# Patient Record
Sex: Male | Born: 1950 | Race: White | Hispanic: No | Marital: Married | Smoking: Former smoker
Health system: Southern US, Community
[De-identification: ages and names within clinical notes are randomized; demographics above are authoritative.]

## PROBLEM LIST (undated history)

## (undated) DIAGNOSIS — I10 Essential (primary) hypertension: Secondary | ICD-10-CM

## (undated) DIAGNOSIS — J984 Other disorders of lung: Secondary | ICD-10-CM

## (undated) DIAGNOSIS — E785 Hyperlipidemia, unspecified: Secondary | ICD-10-CM

## (undated) DIAGNOSIS — J189 Pneumonia, unspecified organism: Secondary | ICD-10-CM

## (undated) DIAGNOSIS — K4090 Unilateral inguinal hernia, without obstruction or gangrene, not specified as recurrent: Secondary | ICD-10-CM

---
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
## 2017-12-06 ENCOUNTER — Ambulatory Visit: Payer: Self-pay | Admitting: Surgery

## 2017-12-06 ENCOUNTER — Encounter: Payer: Self-pay | Admitting: Surgery

## 2017-12-06 DIAGNOSIS — I10 Essential (primary) hypertension: Secondary | ICD-10-CM

## 2017-12-06 DIAGNOSIS — K4091 Unilateral inguinal hernia, without obstruction or gangrene, recurrent: Secondary | ICD-10-CM

## 2017-12-06 NOTE — H&P (View-Only) (Signed)
Jim Rowland Documented: 12/06/2017 9:37 AM Location: [facility] Patient #: [id number] DOB: Jan 29, 1951 Married / Language: [name] / Race: White Male  History of Present Illness [name] MD; 12/06/2017 10:09 AM) The patient is a 67 year old male who presents with an inguinal hernia. Note for "Inguinal hernia": ` ` ` Patient sent for surgical consultation at the request of Dr. Drema Dallas.  Chief Complaint: Left groin hernia ` ` The patient is a very pleasant gentleman originally from [place]. Recalls having hernia repair when he was about 67 years old. Quite active. Left place soccer. However noted some groin swelling earlier this year. Mentioned it to his primary care physician. Inguinal hernia diagnosed. Surgical consultation offered since it seems to be getting larger. He is not smoking 14 years. He is not diabetic. He takes low-dose aspirin. Struggled with some constipation but with the help of prune juice, he now moves his bowels most mornings. He can walk at least half hour without difficulty. No prostate issues. No hesitancy or frequency or nocturia. No history of skin infections. No other abdominal surgeries. Otherwise healthy and active.  (Review of systems as stated in this history (HPI) or in the review of systems. Otherwise all other 12 point ROS are negative) ` ` `   Past Surgical History [name], MD; 12/06/2017 10:10 AM) No pertinent past surgical history Open Inguinal Hernia Surgery - Left [1955]:  Diagnostic Studies History ([name], [place]; 12/06/2017 9:37 AM) Colonoscopy 5-10 years ago  Allergies ([name], [place]; 12/06/2017 9:38 AM) No Known Drug Allergies [12/06/2017]: Allergies Reconciled  Medication History ([name], [place]; 12/06/2017 9:39 AM) Atorvastatin Calcium (10MG  Tablet, Oral) Active. Losartan Potassium-HCTZ (100-25MG  Tablet, Oral) Active. Medications Reconciled  Social History [name], MD; 12/06/2017 10:01 AM) Alcohol use Occasional alcohol use. Caffeine use Coffee. No drug use Tobacco use Former smoker. originally from [place]. Lived in the [place] 1997  Family History ([name], [place]; 12/06/2017 9:37 AM) Hypertension Father. Prostate Cancer Father.  Other Problems ([name], [place]; 12/06/2017 9:37 AM) High blood pressure     Review of Systems ([name]; 12/06/2017 9:37 AM) General Not Present- Appetite Loss, Chills, Fatigue, Fever, Night Sweats, Weight Gain and Weight Loss. Skin Not Present- Change in Wart/Mole, Dryness, Hives, Jaundice, New Lesions, Non-Healing Wounds, Rash and Ulcer. HEENT Not Present- Earache, Hearing Loss, Hoarseness, Nose Bleed, Oral Ulcers, Ringing in the Ears, Seasonal Allergies, Sinus Pain, Sore Throat, Visual Disturbances, Wears glasses/contact lenses and Yellow Eyes. Respiratory Not Present- Bloody sputum, Chronic Cough, Difficulty Breathing, Snoring and Wheezing. Breast Not Present- Breast Mass, Breast Pain, Nipple Discharge and Skin Changes. Cardiovascular Not Present- Chest Pain, Difficulty Breathing Lying Down, Leg Cramps, Palpitations, Rapid Heart Rate, Shortness of Breath and Swelling of Extremities. Gastrointestinal Not Present- Abdominal Pain, Bloating, Bloody Stool, Change in Bowel Habits, Chronic diarrhea, Constipation, Difficulty Swallowing, Excessive gas, Gets full quickly at meals, Hemorrhoids, Indigestion, Nausea, Rectal Pain and Vomiting. Male Genitourinary Not Present- Blood in Urine, Change in Urinary Stream, Frequency, Impotence, Nocturia, Painful Urination, Urgency and Urine Leakage. Musculoskeletal Not Present- Back Pain, Joint Pain, Joint Stiffness, Muscle Pain, Muscle Weakness and Swelling of Extremities. Neurological Not Present- Decreased Memory, Fainting, Headaches, Numbness, Seizures, Tingling, Tremor, Trouble walking and Weakness. Psychiatric Not Present-  Anxiety, Bipolar, Change in Sleep Pattern, Depression, Fearful and Frequent crying. Endocrine Not Present- Cold Intolerance, Excessive Hunger, Hair Changes, Heat Intolerance, Hot flashes and New Diabetes. Hematology Not Present- Blood  Thinners, Easy Bruising, Excessive bleeding, Gland problems, HIV and Persistent Infections.  Vitals ([name]; 12/06/2017 9:38 AM) 12/06/2017 9:38 AM Weight: 179.4 lb Height: 67in Body Surface Area: 1.93 m Body Mass Index: 28.1 kg/m  Temp.: 98.6F  Pulse: 92 (Regular)  BP: 142/80 (Sitting, Left Arm, Standard)      Assessment & Plan [name] MD; 12/06/2017 10:05 AM)  RECURRENT INGUINAL HERNIA OF LEFT SIDE WITHOUT OBSTRUCTION OR GANGRENE (K40.91) Impression: Obvious recurrent left inguinal hernia. No definitely on the right side  I think he would benefit from repair since he is quite active and it is gotten larger in the past few months. He is interested in proceeding.   PREOP - ING HERNIA - ENCOUNTER FOR PREOPERATIVE EXAMINATION FOR GENERAL SURGICAL PROCEDURE (Z01.818)  Current Plans You are being scheduled for surgery- Our schedulers will call you.  You should hear from our office's scheduling department within 5 working days about the location, date, and time of surgery. We try to make accommodations for patient's preferences in scheduling surgery, but sometimes the OR schedule or the surgeon's schedule prevents [place] from making those accommodations.  If you have not heard from our office [phone]) in 5 working days, call the office and ask for your surgeon's nurse.  If you have other questions about your diagnosis, plan, or surgery, call the office and ask for your surgeon's nurse.  Written instructions provided The anatomy & physiology of the abdominal wall and pelvic floor was discussed. The pathophysiology of hernias in the inguinal and pelvic region was discussed. Natural history risks such as progressive  enlargement, pain, incarceration, and strangulation was discussed. Contributors to complications such as smoking, obesity, diabetes, prior surgery, etc were discussed.  I feel the risks of no intervention will lead to serious problems that outweigh the operative risks; therefore, I recommended surgery to reduce and repair the hernia. I explained laparoscopic techniques with possible need for an open approach. I noted usual use of mesh to patch and/or buttress hernia repair  Risks such as bleeding, infection, abscess, need for further treatment, heart attack, death, and other risks were discussed. I noted a good likelihood this will help address the problem. Goals of post-operative recovery were discussed as well. Possibility that this will not correct all symptoms was explained. I stressed the importance of low-impact activity, aggressive pain control, avoiding constipation, & not pushing through pain to minimize risk of post-operative chronic pain or injury. Possibility of reherniation was discussed. We will work to minimize complications.  An educational handout further explaining the pathology & treatment options was given as well. Questions were answered. The patient expresses understanding & wishes to proceed with surgery.  Pt Education - Pamphlet Given - Laparoscopic Hernia Repair: discussed with patient and provided information. Pt Education - CCS Pain Control (Caffie Sotto) Pt Education - CCS Hernia Post-Op HCI (Alaylah Heatherington): discussed with patient and provided information. Pt Education - CCS Mesh education: discussed with patient and provided information.  [name], MD, FACS, MASCRS Gastrointestinal and Minimally Invasive Surgery    [address] [place], [place] [address] [phone] Main / Paging [phone] Fax

## 2017-12-06 NOTE — H&P (Signed)
Jim Rowland Documented: 12/06/2017 9:37 AM Location: [facility] Patient #: [id number] DOB: 17-Feb-1951 Married / Language: Jim Rowland / Race: White Male  History of Present Illness Jim Hector MD; 12/06/2017 10:09 AM) The patient is a 67 year old male who presents with an inguinal hernia. Note for "Inguinal hernia": ` ` ` Patient sent for surgical consultation at the request of Dr. Drema Rowland.  Chief Complaint: Left groin hernia ` ` The patient is a very pleasant gentleman originally from [place]. Recalls having hernia repair when he was about 67 years old. Quite active. Left place soccer. However noted some groin swelling earlier this year. Mentioned it to his primary care physician. Inguinal hernia diagnosed. Surgical consultation offered since it seems to be getting larger. He is not smoking 14 years. He is not diabetic. He takes low-dose aspirin. Struggled with some constipation but with the help of prune juice, he now moves his bowels most mornings. He can walk at least half hour without difficulty. No prostate issues. No hesitancy or frequency or nocturia. No history of skin infections. No other abdominal surgeries. Otherwise healthy and active.  (Review of systems as stated in this history (HPI) or in the review of systems. Otherwise all other 12 point ROS are negative) ` ` `   Past Surgical History Jim Hector, MD; 12/06/2017 10:10 AM) No pertinent past surgical history Open Inguinal Hernia Surgery - Left [1955]:  Diagnostic Studies History (Jim Rowland, [place]; 12/06/2017 9:37 AM) Colonoscopy 5-10 years ago  Allergies (Jim Rowland, [place]; 12/06/2017 9:38 AM) No Known Drug Allergies [12/06/2017]: Allergies Reconciled  Medication History (Jim Rowland, [place]; 12/06/2017 9:39 AM) Atorvastatin Calcium (10MG  Tablet, Oral) Active. Losartan Potassium-HCTZ (100-25MG  Tablet, Oral) Active. Medications Reconciled  Social History Jim Hector, MD; 12/06/2017 10:01 AM) Alcohol use Occasional alcohol use. Caffeine use Coffee. No drug use Tobacco use Former smoker. originally from [place]. Lived in [place] 1997  Family History (Jim Rowland, [place]; 12/06/2017 9:37 AM) Hypertension Father. Prostate Cancer Father.  Other Problems (Jim Rowland, [place]; 12/06/2017 9:37 AM) High blood pressure     Review of Systems (Jim Rowland; 12/06/2017 9:37 AM) General Not Present- Appetite Loss, Chills, Fatigue, Fever, Night Sweats, Weight Gain and Weight Loss. Skin Not Present- Change in Wart/Mole, Dryness, Hives, Jaundice, New Lesions, Non-Healing Wounds, Rash and Ulcer. HEENT Not Present- Earache, Hearing Loss, Hoarseness, Nose Bleed, Oral Ulcers, Ringing in the Ears, Seasonal Allergies, Sinus Pain, Sore Throat, Visual Disturbances, Wears glasses/contact lenses and Yellow Eyes. Respiratory Not Present- Bloody sputum, Chronic Cough, Difficulty Breathing, Snoring and Wheezing. Breast Not Present- Breast Mass, Breast Pain, Nipple Discharge and Skin Changes. Cardiovascular Not Present- Chest Pain, Difficulty Breathing Lying Down, Leg Cramps, Palpitations, Rapid Heart Rate, Shortness of Breath and Swelling of Extremities. Gastrointestinal Not Present- Abdominal Pain, Bloating, Bloody Stool, Change in Bowel Habits, Chronic diarrhea, Constipation, Difficulty Swallowing, Excessive gas, Gets full quickly at meals, Hemorrhoids, Indigestion, Nausea, Rectal Pain and Vomiting. Male Genitourinary Not Present- Blood in Urine, Change in Urinary Stream, Frequency, Impotence, Nocturia, Painful Urination, Urgency and Urine Leakage. Musculoskeletal Not Present- Back Pain, Joint Pain, Joint Stiffness, Muscle Pain, Muscle Weakness and Swelling of Extremities. Neurological Not Present- Decreased Memory, Fainting, Headaches, Numbness, Seizures, Tingling, Tremor, Trouble walking and Weakness. Psychiatric Not Present-  Anxiety, Bipolar, Change in Sleep Pattern, Depression, Fearful and Frequent crying. Endocrine Not Present- Cold Intolerance, Excessive Hunger, Hair Changes, Heat Intolerance, Hot flashes and New Diabetes. Hematology Not Present- Blood  Thinners, Easy Bruising, Excessive bleeding, Gland problems, HIV and Persistent Infections.  Vitals (Jim Rowland; 12/06/2017 9:38 AM) 12/06/2017 9:38 AM Weight: 179.4 lb Height: 67in Body Surface Area: 1.93 m Body Mass Index: 28.1 kg/m  Temp.: 98.53F  Pulse: 92 (Regular)  BP: 142/80 (Sitting, Left Arm, Standard)      Assessment & Plan Jim Hector MD; 12/06/2017 10:05 AM)  RECURRENT INGUINAL HERNIA OF LEFT SIDE WITHOUT OBSTRUCTION OR GANGRENE (K40.91) Impression: Obvious recurrent left inguinal hernia. No definitely on the right side  I think he would benefit from repair since he is quite active and it is gotten larger in the past few months. He is interested in proceeding.   PREOP - ING HERNIA - ENCOUNTER FOR PREOPERATIVE EXAMINATION FOR GENERAL SURGICAL PROCEDURE (Z01.818)  Current Plans You are being scheduled for surgery- Our schedulers will call you.  You should hear from our office's scheduling department within 5 working days about the location, date, and time of surgery. We try to make accommodations for patient's preferences in scheduling surgery, but sometimes the OR schedule or the surgeon's schedule prevents [place] from making those accommodations.  If you have not heard from our office [phone]) in 5 working days, call the office and ask for your surgeon's nurse.  If you have other questions about your diagnosis, plan, or surgery, call the office and ask for your surgeon's nurse.  Written instructions provided The anatomy & physiology of the abdominal wall and pelvic floor was discussed. The pathophysiology of hernias in the inguinal and pelvic region was discussed. Natural history risks such as progressive  enlargement, pain, incarceration, and strangulation was discussed. Contributors to complications such as smoking, obesity, diabetes, prior surgery, etc were discussed.  I feel the risks of no intervention will lead to serious problems that outweigh the operative risks; therefore, I recommended surgery to reduce and repair the hernia. I explained laparoscopic techniques with possible need for an open approach. I noted usual use of mesh to patch and/or buttress hernia repair  Risks such as bleeding, infection, abscess, need for further treatment, heart attack, death, and other risks were discussed. I noted a good likelihood this will help address the problem. Goals of post-operative recovery were discussed as well. Possibility that this will not correct all symptoms was explained. I stressed the importance of low-impact activity, aggressive pain control, avoiding constipation, & not pushing through pain to minimize risk of post-operative chronic pain or injury. Possibility of reherniation was discussed. We will work to minimize complications.  An educational handout further explaining the pathology & treatment options was given as well. Questions were answered. The patient expresses understanding & wishes to proceed with surgery.  Pt Education - Pamphlet Given - Laparoscopic Hernia Repair: discussed with patient and provided information. Pt Education - CCS Pain Control (Folasade Mooty) Pt Education - CCS Hernia Post-Op HCI (Sybella Harnish): discussed with patient and provided information. Pt Education - CCS Mesh education: discussed with patient and provided information.  Jim Hector, MD, FACS, MASCRS Gastrointestinal and Minimally Invasive Surgery    [address] [place], [place] [address] [phone] Main / Paging [phone] Fax

## 2017-12-08 NOTE — Patient Instructions (Signed)
Jim Rowland  12/08/2017   Your procedure is scheduled on: 12-15-17   Report to [facility] Main  Entrance    Report to admitting at 9:00AM    Call this number if you have problems the morning of surgery [phone]     Remember: NO SOLID FOOD AFTER MIDNIGHT THE NIGHT PRIOR TO SURGERY. NOTHING BY MOUTH EXCEPT CLEAR LIQUIDS UNTIL 3 HOURS PRIOR TO [place] SURGERY. PLEASE FINISH ENSURE DRINK PER SURGEON ORDER 3 HOURS PRIOR TO SCHEDULED SURGERY TIME WHICH NEEDS TO BE COMPLETED AT ___8:00AM______.   CLEAR LIQUID DIET   Foods Allowed                                                                     Foods Excluded  Coffee and tea, regular and decaf                             liquids that you cannot  Plain Jell-O in any flavor                                             see through such as: Fruit ices (not with fruit pulp)                                     milk, soups, orange juice  Iced Popsicles                                    All solid food Carbonated beverages, regular and diet                                    Cranberry, grape and apple juices Sports drinks like Gatorade Lightly seasoned clear broth or consume(fat free) Sugar, honey syrup  Sample Menu Breakfast                                Lunch                                     Supper Cranberry juice                    Beef broth                            Chicken broth Jell-O                                     Grape juice  Apple juice Coffee or tea                        Jell-O                                      Popsicle                                                Coffee or tea                        Coffee or tea  _____________________________________________________________________       Take these medicines the morning of surgery with A SIP OF WATER: ATORVASTATIN                                You may not have any metal on your body including hair pins  and              piercings  Do not wear jewelry, make-up, lotions, powders or perfumes, deodorant                      Men may shave face and neck.   Do not bring valuables to the hospital. Early.  Contacts, dentures or bridgework may not be worn into surgery.     Patients discharged the day of surgery will not be allowed to drive home.  Name and phone number of your driver:  Special Instructions: N/A              Please read over the following fact sheets you were given: _____________________________________________________________________             [facility] - Preparing for Surgery Before surgery, you can play an important role.  Because skin is not sterile, your skin needs to be as free of germs as possible.  You can reduce the number of germs on your skin by washing with CHG (chlorahexidine gluconate) soap before surgery.  CHG is an antiseptic cleaner which kills germs and bonds with the skin to continue killing germs even after washing. Please DO NOT use if you have an allergy to CHG or antibacterial soaps.  If your skin becomes reddened/irritated stop using the CHG and inform your nurse when you arrive at Short Stay. Do not shave (including legs and underarms) for at least 48 hours prior to the first CHG shower.  You may shave your face/neck. Please follow these instructions carefully:  1.  Shower with CHG Soap the night before surgery and the  morning of Surgery.  2.  If you choose to wash your hair, wash your hair first as usual with your  normal  shampoo.  3.  After you shampoo, rinse your hair and body thoroughly to remove the  shampoo.                           4.  Use CHG as you would any other liquid soap.  You can apply chg  directly  to the skin and wash                       Gently with a scrungie or clean washcloth.  5.  Apply the CHG Soap to your body ONLY FROM THE NECK DOWN.   Do not use on face/ open                            Wound or open sores. Avoid contact with eyes, ears mouth and genitals (private parts).                       Wash face,  Genitals (private parts) with your normal soap.             6.  Wash thoroughly, paying special attention to the area where your surgery  will be performed.  7.  Thoroughly rinse your body with warm water from the neck down.  8.  DO NOT shower/wash with your normal soap after using and rinsing off  the CHG Soap.                9.  Pat yourself dry with a clean towel.            10.  Wear clean pajamas.            11.  Place clean sheets on your bed the night of your first shower and do not  sleep with pets. Day of Surgery : Do not apply any lotions/deodorants the morning of surgery.  Please wear clean clothes to the hospital/surgery center.  FAILURE TO FOLLOW THESE INSTRUCTIONS MAY RESULT IN THE CANCELLATION OF YOUR SURGERY PATIENT SIGNATURE_________________________________  NURSE SIGNATURE__________________________________  ________________________________________________________________________

## 2017-12-09 ENCOUNTER — Other Ambulatory Visit: Payer: Self-pay

## 2017-12-09 ENCOUNTER — Encounter (HOSPITAL_COMMUNITY): Payer: Self-pay

## 2017-12-09 ENCOUNTER — Encounter (HOSPITAL_COMMUNITY)
Admission: RE | Admit: 2017-12-09 | Discharge: 2017-12-09 | Disposition: A | Discharge status: Home / Self Care | Payer: 59 | Source: Ambulatory Visit | Attending: Surgery | Admitting: Surgery

## 2017-12-09 DIAGNOSIS — Z01812 Encounter for preprocedural laboratory examination: Secondary | ICD-10-CM | POA: Insufficient documentation

## 2017-12-09 DIAGNOSIS — Z01810 Encounter for preprocedural cardiovascular examination: Secondary | ICD-10-CM | POA: Diagnosis not present

## 2017-12-09 DIAGNOSIS — K4091 Unilateral inguinal hernia, without obstruction or gangrene, recurrent: Secondary | ICD-10-CM | POA: Diagnosis not present

## 2017-12-09 HISTORY — DX: Unilateral inguinal hernia, without obstruction or gangrene, not specified as recurrent: K40.90

## 2017-12-09 HISTORY — DX: Other disorders of lung: J98.4

## 2017-12-09 HISTORY — DX: Pneumonia, unspecified organism: J18.9

## 2017-12-09 HISTORY — DX: Essential (primary) hypertension: I10

## 2017-12-09 HISTORY — DX: Hyperlipidemia, unspecified: E78.5

## 2017-12-09 LAB — BASIC METABOLIC PANEL
Anion gap: 7 (ref 5–15)
BUN: 20 mg/dL (ref 8–23)
CHLORIDE: 112 mmol/L — AB (ref 98–111)
CO2: 25 mmol/L (ref 22–32)
CREATININE: 1.14 mg/dL (ref 0.61–1.24)
Calcium: 9.1 mg/dL (ref 8.9–10.3)
GFR calc Af Amer: >60 mL/min (ref >60)
GFR calc non Af Amer: >60 mL/min (ref >60)
GLUCOSE: 109 mg/dL — AB (ref 70–99)
Potassium: 3.7 mmol/L (ref 3.5–5.1)
Sodium: 144 mmol/L (ref 135–145)

## 2017-12-09 LAB — CBC
HCT: 43.1 % (ref 39.0–52.0)
Hemoglobin: 14.8 g/dL (ref 13.0–17.0)
MCH: 28.7 pg (ref 26.0–34.0)
MCHC: 34.3 g/dL (ref 30.0–36.0)
MCV: 83.7 fL (ref 78.0–100.0)
PLATELETS: 241 10*3/uL (ref 150–400)
RBC: 5.15 MIL/uL (ref 4.22–5.81)
RDW: 12.4 % (ref 11.5–15.5)
WBC: 9.4 10*3/uL (ref 4.0–10.5)

## 2017-12-14 ENCOUNTER — Encounter (HOSPITAL_COMMUNITY): Payer: Self-pay | Admitting: Certified Registered Nurse Anesthetist

## 2017-12-14 MED ORDER — BUPIVACAINE LIPOSOME 1.3 % IJ SUSP
20.0000 mL | Freq: Once | INTRAMUSCULAR | Status: DC
  Filled 2017-12-14: qty 20

## 2017-12-15 ENCOUNTER — Other Ambulatory Visit: Payer: Self-pay

## 2017-12-15 ENCOUNTER — Encounter (HOSPITAL_COMMUNITY): Payer: Self-pay | Admitting: Certified Registered Nurse Anesthetist

## 2017-12-15 ENCOUNTER — Ambulatory Visit (HOSPITAL_COMMUNITY): Payer: 59 | Admitting: Anesthesiology

## 2017-12-15 ENCOUNTER — Encounter (HOSPITAL_COMMUNITY)
Admission: RE | Disposition: A | Discharge status: Home / Self Care | Payer: Self-pay | Source: Ambulatory Visit | Attending: Surgery

## 2017-12-15 ENCOUNTER — Ambulatory Visit (HOSPITAL_COMMUNITY)
Admission: RE | Admit: 2017-12-15 | Discharge: 2017-12-15 | Disposition: A | Discharge status: Home / Self Care | Payer: 59 | Source: Ambulatory Visit | Attending: Surgery | Admitting: Surgery

## 2017-12-15 DIAGNOSIS — D176 Benign lipomatous neoplasm of spermatic cord: Secondary | ICD-10-CM | POA: Diagnosis not present

## 2017-12-15 DIAGNOSIS — K4091 Unilateral inguinal hernia, without obstruction or gangrene, recurrent: Secondary | ICD-10-CM | POA: Insufficient documentation

## 2017-12-15 DIAGNOSIS — K4120 Bilateral femoral hernia, without obstruction or gangrene, not specified as recurrent: Secondary | ICD-10-CM | POA: Diagnosis not present

## 2017-12-15 DIAGNOSIS — K458 Other specified abdominal hernia without obstruction or gangrene: Secondary | ICD-10-CM | POA: Insufficient documentation

## 2017-12-15 DIAGNOSIS — Z87891 Personal history of nicotine dependence: Secondary | ICD-10-CM | POA: Diagnosis not present

## 2017-12-15 DIAGNOSIS — I10 Essential (primary) hypertension: Secondary | ICD-10-CM | POA: Diagnosis not present

## 2017-12-15 DIAGNOSIS — K4090 Unilateral inguinal hernia, without obstruction or gangrene, not specified as recurrent: Secondary | ICD-10-CM | POA: Diagnosis present

## 2017-12-15 HISTORY — PX: INSERTION OF MESH: SHX5868

## 2017-12-15 HISTORY — PX: INGUINAL HERNIA REPAIR: SHX194

## 2017-12-15 SURGERY — REPAIR, HERNIA, INGUINAL, LAPAROSCOPIC
Anesthesia: General

## 2017-12-15 MED ORDER — BUPIVACAINE-EPINEPHRINE (PF) 0.25% -1:200000 IJ SOLN
INTRAMUSCULAR | Status: AC
  Filled 2017-12-15: qty 90

## 2017-12-15 MED ORDER — PROMETHAZINE HCL 25 MG/ML IJ SOLN: 6.2500 mg | INTRAMUSCULAR | Status: DC | PRN

## 2017-12-15 MED ORDER — LACTATED RINGERS IR SOLN
Status: DC | PRN
  Administered 2017-12-15: 1000 mL

## 2017-12-15 MED ORDER — LABETALOL HCL 5 MG/ML IV SOLN
INTRAVENOUS | Status: AC
  Administered 2017-12-15: 5 mg
  Filled 2017-12-15: qty 4

## 2017-12-15 MED ORDER — MEPERIDINE HCL 50 MG/ML IJ SOLN: 6.2500 mg | INTRAMUSCULAR | Status: DC | PRN

## 2017-12-15 MED ORDER — ONDANSETRON HCL 4 MG/2ML IJ SOLN
INTRAMUSCULAR | Status: AC
  Filled 2017-12-15: qty 2

## 2017-12-15 MED ORDER — ROCURONIUM BROMIDE 10 MG/ML (PF) SYRINGE
PREFILLED_SYRINGE | INTRAVENOUS | Status: DC | PRN
  Administered 2017-12-15: 50 mg via INTRAVENOUS
  Administered 2017-12-15: 10 mg via INTRAVENOUS

## 2017-12-15 MED ORDER — ROCURONIUM BROMIDE 10 MG/ML (PF) SYRINGE
PREFILLED_SYRINGE | INTRAVENOUS | Status: AC
  Filled 2017-12-15: qty 10

## 2017-12-15 MED ORDER — DEXAMETHASONE SODIUM PHOSPHATE 10 MG/ML IJ SOLN
INTRAMUSCULAR | Status: AC
  Filled 2017-12-15: qty 1

## 2017-12-15 MED ORDER — KETAMINE HCL 10 MG/ML IJ SOLN
INTRAMUSCULAR | Status: DC | PRN
  Administered 2017-12-15: 40 mg via INTRAVENOUS

## 2017-12-15 MED ORDER — GABAPENTIN 300 MG PO CAPS
300.0000 mg | ORAL_CAPSULE | ORAL | Status: AC
  Administered 2017-12-15: 300 mg via ORAL
  Filled 2017-12-15: qty 1

## 2017-12-15 MED ORDER — ACETAMINOPHEN 500 MG PO TABS
1000.0000 mg | ORAL_TABLET | ORAL | Status: AC
  Administered 2017-12-15: 1000 mg via ORAL
  Filled 2017-12-15: qty 2

## 2017-12-15 MED ORDER — DEXAMETHASONE SODIUM PHOSPHATE 4 MG/ML IJ SOLN
INTRAMUSCULAR | Status: DC | PRN
  Administered 2017-12-15: 5 mg via INTRAVENOUS

## 2017-12-15 MED ORDER — FENTANYL CITRATE (PF) 250 MCG/5ML IJ SOLN
INTRAMUSCULAR | Status: AC
  Filled 2017-12-15: qty 5

## 2017-12-15 MED ORDER — MIDAZOLAM HCL 5 MG/5ML IJ SOLN
INTRAMUSCULAR | Status: DC | PRN
  Administered 2017-12-15: 2 mg via INTRAVENOUS

## 2017-12-15 MED ORDER — LIDOCAINE 2% (20 MG/ML) 5 ML SYRINGE
INTRAMUSCULAR | Status: DC | PRN
  Administered 2017-12-15: 1.5 mg/kg/h via INTRAVENOUS

## 2017-12-15 MED ORDER — MIDAZOLAM HCL 2 MG/2ML IJ SOLN: 0.5000 mg | Freq: Once | INTRAMUSCULAR | Status: DC | PRN

## 2017-12-15 MED ORDER — CEFAZOLIN SODIUM-DEXTROSE 2-4 GM/100ML-% IV SOLN
2.0000 g | INTRAVENOUS | Status: AC
  Administered 2017-12-15: 2 g via INTRAVENOUS
  Filled 2017-12-15: qty 100

## 2017-12-15 MED ORDER — EPHEDRINE 5 MG/ML INJ
INTRAVENOUS | Status: AC
  Filled 2017-12-15: qty 10

## 2017-12-15 MED ORDER — SUGAMMADEX SODIUM 200 MG/2ML IV SOLN
INTRAVENOUS | Status: AC
  Filled 2017-12-15: qty 2

## 2017-12-15 MED ORDER — LIDOCAINE 2% (20 MG/ML) 5 ML SYRINGE
INTRAMUSCULAR | Status: DC | PRN
  Administered 2017-12-15: 50 mg via INTRAVENOUS

## 2017-12-15 MED ORDER — MIDAZOLAM HCL 2 MG/2ML IJ SOLN
INTRAMUSCULAR | Status: AC
  Filled 2017-12-15: qty 2

## 2017-12-15 MED ORDER — SUGAMMADEX SODIUM 200 MG/2ML IV SOLN
INTRAVENOUS | Status: DC | PRN
  Administered 2017-12-15: 200 mg via INTRAVENOUS

## 2017-12-15 MED ORDER — CHLORHEXIDINE GLUCONATE CLOTH 2 % EX PADS: 6.0000 | MEDICATED_PAD | Freq: Once | CUTANEOUS | Status: DC

## 2017-12-15 MED ORDER — PROPOFOL 10 MG/ML IV BOLUS
INTRAVENOUS | Status: DC | PRN
  Administered 2017-12-15: 150 mg via INTRAVENOUS

## 2017-12-15 MED ORDER — 0.9 % SODIUM CHLORIDE (POUR BTL) OPTIME
TOPICAL | Status: DC | PRN
  Administered 2017-12-15: 1000 mL

## 2017-12-15 MED ORDER — FENTANYL CITRATE (PF) 100 MCG/2ML IJ SOLN
INTRAMUSCULAR | Status: DC | PRN
  Administered 2017-12-15: 50 ug via INTRAVENOUS
  Administered 2017-12-15: 100 ug via INTRAVENOUS
  Administered 2017-12-15 (×2): 50 ug via INTRAVENOUS

## 2017-12-15 MED ORDER — BUPIVACAINE-EPINEPHRINE 0.25% -1:200000 IJ SOLN
INTRAMUSCULAR | Status: DC | PRN
  Administered 2017-12-15: 90 mL

## 2017-12-15 MED ORDER — HYDROMORPHONE HCL 1 MG/ML IJ SOLN: 0.2500 mg | INTRAMUSCULAR | Status: DC | PRN

## 2017-12-15 MED ORDER — PROPOFOL 10 MG/ML IV BOLUS
INTRAVENOUS | Status: AC
  Filled 2017-12-15: qty 20

## 2017-12-15 MED ORDER — TRAMADOL HCL 50 MG PO TABS: 50.0000 mg | ORAL_TABLET | Freq: Four times a day (QID) | ORAL | 0 refills | Status: AC | PRN

## 2017-12-15 MED ORDER — STERILE WATER FOR IRRIGATION IR SOLN
Status: DC | PRN
  Administered 2017-12-15: 1000 mL

## 2017-12-15 MED ORDER — LACTATED RINGERS IV SOLN
INTRAVENOUS | Status: DC
  Administered 2017-12-15 (×2): via INTRAVENOUS

## 2017-12-15 MED ORDER — EPHEDRINE SULFATE-NACL 50-0.9 MG/10ML-% IV SOSY
PREFILLED_SYRINGE | INTRAVENOUS | Status: DC | PRN
  Administered 2017-12-15: 10 mg via INTRAVENOUS
  Administered 2017-12-15: 5 mg via INTRAVENOUS

## 2017-12-15 MED ORDER — ONDANSETRON HCL 4 MG/2ML IJ SOLN
INTRAMUSCULAR | Status: DC | PRN
  Administered 2017-12-15: 4 mg via INTRAVENOUS

## 2017-12-15 MED ORDER — SCOPOLAMINE 1 MG/3DAYS TD PT72
1.0000 | MEDICATED_PATCH | Freq: Once | TRANSDERMAL | Status: AC
  Administered 2017-12-15: 1 via TRANSDERMAL
  Filled 2017-12-15: qty 1

## 2017-12-15 MED ORDER — LIDOCAINE 2% (20 MG/ML) 5 ML SYRINGE
INTRAMUSCULAR | Status: AC
  Filled 2017-12-15: qty 5

## 2017-12-15 SURGICAL SUPPLY — 35 items
CABLE HIGH FREQUENCY MONO STRZ (ELECTRODE) ×3 IMPLANT
CHLORAPREP W/TINT 26ML (MISCELLANEOUS) ×3 IMPLANT
COVER SURGICAL LIGHT HANDLE (MISCELLANEOUS) ×3 IMPLANT
DECANTER SPIKE VIAL GLASS SM (MISCELLANEOUS) ×3 IMPLANT
DEVICE SECURE STRAP 25 ABSORB (INSTRUMENTS) IMPLANT
DRAPE WARM FLUID 44X44 (DRAPE) ×3 IMPLANT
DRSG TEGADERM 2-3/8X2-3/4 SM (GAUZE/BANDAGES/DRESSINGS) ×3 IMPLANT
DRSG TEGADERM 4X4.75 (GAUZE/BANDAGES/DRESSINGS) ×3 IMPLANT
ELECT REM PT RETURN 15FT ADLT (MISCELLANEOUS) ×3 IMPLANT
GAUZE SPONGE 2X2 8PLY STRL LF (GAUZE/BANDAGES/DRESSINGS) ×1 IMPLANT
GLOVE ECLIPSE 8.0 STRL XLNG CF (GLOVE) ×3 IMPLANT
GLOVE INDICATOR 8.0 STRL GRN (GLOVE) ×3 IMPLANT
GOWN STRL REUS W/TWL XL LVL3 (GOWN DISPOSABLE) ×6 IMPLANT
IRRIG SUCT STRYKERFLOW 2 WTIP (MISCELLANEOUS) ×3
IRRIGATION SUCT STRKRFLW 2 WTP (MISCELLANEOUS) ×1 IMPLANT
KIT BASIN OR (CUSTOM PROCEDURE TRAY) ×3 IMPLANT
MESH HERNIA 6X6 BARD (Mesh General) ×3 IMPLANT
MESH HERNIA BARD 6X6 (Mesh General) ×6 IMPLANT
NEEDLE INSUFFLATION 14GA 120MM (NEEDLE) IMPLANT
PAD POSITIONING PINK XL (MISCELLANEOUS) ×3 IMPLANT
SCISSORS LAP 5X35 DISP (ENDOMECHANICALS) ×3 IMPLANT
SLEEVE ADV FIXATION 5X100MM (TROCAR) ×3 IMPLANT
SPONGE GAUZE 2X2 STER 10/PKG (GAUZE/BANDAGES/DRESSINGS) ×2
SUT MNCRL AB 4-0 PS2 18 (SUTURE) ×3 IMPLANT
SUT PDS AB 1 CT1 27 (SUTURE) ×6 IMPLANT
SUT VIC AB 2-0 SH 27 (SUTURE)
SUT VIC AB 2-0 SH 27X BRD (SUTURE) IMPLANT
SUT VICRYL 0 UR6 27IN ABS (SUTURE) IMPLANT
TACKER 5MM HERNIA 3.5CML NAB (ENDOMECHANICALS) IMPLANT
TOWEL OR 17X26 10 PK STRL BLUE (TOWEL DISPOSABLE) ×3 IMPLANT
TOWEL OR NON WOVEN STRL DISP B (DISPOSABLE) ×3 IMPLANT
TRAY LAPAROSCOPIC (CUSTOM PROCEDURE TRAY) ×3 IMPLANT
TROCAR ADV FIXATION 5X100MM (TROCAR) ×3 IMPLANT
TROCAR XCEL BLUNT TIP 100MML (ENDOMECHANICALS) ×3 IMPLANT
TUBING INSUF HEATED (TUBING) ×3 IMPLANT

## 2017-12-15 NOTE — Transfer of Care (Signed)
Immediate Anesthesia Transfer of Care Note  Patient: Jim Rowland  Procedure(s) Performed: LAPAROSCOPIC BILATERAL FEMORAL HERNIAS, RIGHT OBTUATOR HERNIA, RECURRENT LEFT HERNIA REPAIR, BILATERAL TAP BLOCK INSERTION OF MESH  Patient Location: PACU  Anesthesia Type:General  Level of Consciousness: awake, alert , oriented and patient cooperative  Airway & Oxygen Therapy: Patient Spontanous Breathing and Patient connected to face mask  Post-op Assessment: Report given to RN and Post -op Vital signs reviewed and stable  Post vital signs: Reviewed and stable  Last Vitals:  Vitals Value Taken Time  BP 156/88 12/15/2017 12:27 PM  Temp    Pulse 80 12/15/2017 12:28 PM  Resp 14 12/15/2017 12:28 PM  SpO2 100 % 12/15/2017 12:28 PM  Vitals shown include unvalidated device data.  Last Pain:  Vitals:   12/15/17 0901  TempSrc: Oral         Complications: No apparent anesthesia complications

## 2017-12-15 NOTE — Progress Notes (Signed)
Dr Royce Macadamia aware of BP 173/91. Order received to give 5-10 labetalol.

## 2017-12-15 NOTE — Anesthesia Preprocedure Evaluation (Addendum)
Anesthesia Evaluation  Patient identified by MRN, date of birth, ID band Patient awake    Reviewed: Allergy & Precautions, NPO status , Patient's Chart, lab work & pertinent test results  History of Anesthesia Complications Negative for: history of anesthetic complications  Airway Mallampati: II  TM Distance: >3 FB Neck ROM: Full    Dental  (+) Dental Advisory Given, Caps   Pulmonary former smoker (quit 2005),    breath sounds clear to auscultation       Cardiovascular hypertension, Pt. on medications (-) angina Rhythm:Regular Rate:Normal     Neuro/Psych negative neurological ROS     GI/Hepatic negative GI ROS, Neg liver ROS,   Endo/Other  negative endocrine ROS  Renal/GU negative Renal ROS     Musculoskeletal   Abdominal   Peds  Hematology negative hematology ROS (+)   Anesthesia Other Findings   Reproductive/Obstetrics                            Anesthesia Physical Anesthesia Plan  ASA: II  Anesthesia Plan: General   Post-op Pain Management:    Induction: Intravenous  PONV Risk Score and Plan: 3 and Ondansetron, Dexamethasone and Scopolamine patch - Pre-op  Airway Management Planned: Oral ETT  Additional Equipment:   Intra-op Plan:   Post-operative Plan: Extubation in OR  Informed Consent: I have reviewed the patients History and Physical, chart, labs and discussed the procedure including the risks, benefits and alternatives for the proposed anesthesia with the patient or authorized representative who has indicated his/her understanding and acceptance.   Dental advisory given  Plan Discussed with: CRNA and Surgeon  Anesthesia Plan Comments: (Plan routine monitors, GETA)       Anesthesia Quick Evaluation

## 2017-12-15 NOTE — Anesthesia Procedure Notes (Signed)
Procedure Name: Intubation Date/Time: 12/15/2017 11:56 AM Performed by: Claudia Desanctis, CRNA Pre-anesthesia Checklist: Patient identified, Emergency Drugs available, Suction available and Patient being monitored Patient Re-evaluated:Patient Re-evaluated prior to induction Oxygen Delivery Method: Circle system utilized Preoxygenation: Pre-oxygenation with 100% oxygen Induction Type: IV induction Ventilation: Two handed mask ventilation required and Oral airway inserted - appropriate to patient size Laryngoscope Size: 2 and Miller Grade View: Grade I Tube type: Oral Tube size: 7.5 mm Number of attempts: 1 Airway Equipment and Method: Stylet Placement Confirmation: ETT inserted through vocal cords under direct vision,  positive ETCO2 and breath sounds checked- equal and bilateral Tube secured with: Tape Dental Injury: Teeth and Oropharynx as per pre-operative assessment

## 2017-12-15 NOTE — Op Note (Signed)
12/15/2017  12:22 PM  PATIENT:  Jim Rowland  67 y.o. male  Patient Care Team: Kathyrn Lass, MD as PCP - General (Family Medicine) Michael Boston, MD as Consulting Physician (General Surgery)  PRE-OPERATIVE DIAGNOSIS:  Recurrent left inguinal hernia  Possible right inguinal hernia.  POST-OPERATIVE DIAGNOSIS:   Recurrent left inguinal hernia Bilateral femoral hernias Right obturator hernia  PROCEDURE:   LAPAROSCOPIC RECURRENT LEFT INGUINAL HERNIA REPAIR WITH MESH lAPAROSCOPIC BILATERAL FEMORAL HERNIA REPAIRS  LAPAROSCOPIC RIGHT OBTUATOR HERNIA REPAIR BILATERAL TAP BLOCK INSERTION OF MESH  SURGEON:  [name], MD  ASSISTANT: None  ANESTHESIA:     Regional ilioinguinal and genitofemoral and spermatic cord nerve blocks   Nerve block provided with 0.25% bupivacaine as a Bilateral TAP block x30 mL at each flank under laparoscopic guidance   General  EBL:  Total I/O In: 1000 [I.V.:1000] Out: 25 [Blood:25].  See anesthesia record  Delay start of Pharmacological VTE agent (>24hrs) due to surgical blood loss or risk of bleeding:  no  DRAINS: NONE  SPECIMEN:  NONE  DISPOSITION OF SPECIMEN:  N/A  COUNTS:  YES  PLAN OF CARE: Discharge to home after PACU  PATIENT DISPOSITION:  PACU - hemodynamically stable.  INDICATION: Pleasant gentleman with history of prior inguinal hernia repairs recurrent swelling in the left side suspicious for hernia.  Surgical consultation requested.  The ureters are appropriate in the right laterally as well.  All the same recommendation for laparoscopic exploration and repair of hernias found.  The anatomy & physiology of the abdominal wall and pelvic floor was discussed.  The pathophysiology of hernias in the inguinal and pelvic region was discussed.  Natural history risks such as progressive enlargement, pain, incarceration & strangulation was discussed.   Contributors to complications such as smoking, obesity, diabetes, prior surgery, etc  were discussed.    I feel the risks of no intervention will lead to serious problems that outweigh the operative risks; therefore, I recommended surgery to reduce and repair the hernia.  I explained laparoscopic techniques with possible need for an open approach.  I noted usual use of mesh to patch and/or buttress hernia repair  Risks such as bleeding, infection, abscess, need for further treatment, heart attack, death, and other risks were discussed.  I noted a good likelihood this will help address the problem.   Goals of post-operative recovery were discussed as well.  Possibility that this will not correct all symptoms was explained.  I stressed the importance of low-impact activity, aggressive pain control, avoiding constipation, & not pushing through pain to minimize risk of post-operative chronic pain or injury. Possibility of reherniation was discussed.  We will work to minimize complications.     An educational handout further explaining the pathology & treatment options was given as well.  Questions were answered.  The patient expresses understanding & wishes to proceed with surgery.  OR FINDINGS: Direct and indirect pantaloon-type recurrent left inguinal hernias.  Moderate size femoral hernia as well.  No obturator hernia.  On the right side, no evidence of any space or indirect hernia.  Moderate sized femoral hernia.  He actually had an obturator hernia as well.  DESCRIPTION:  The patient was identified & brought into the operating room. The patient was positioned supine with arms tucked. SCDs were active during the entire case. The patient underwent general anesthesia without any difficulty.  The abdomen was prepped and draped in a sterile fashion. The patient's bladder was emptied.  A Surgical Timeout confirmed our plan.  Bilateral/genitofemoral/spermatic cord blocks were done.    30 mL for each side regional field block were done I made a transverse incision through the inferior  umbilical fold.  I made a small transverse nick through the anterior rectus fascia contralateral to the inguinal hernia side and placed a 0-vicryl stitch through the fascia.  I placed a Hasson trocar into the preperitoneal plane.  Entry was clean.  We induced carbon dioxide insufflation. Camera inspection revealed no injury.  I used a 104mm angled scope to bluntly free the peritoneum off the infraumbilical anterior abdominal wall.  I created enough of a preperitoneal pocket to place 54mm ports into the right & left mid-abdomen into this preperitoneal cavity.  Bilateral transabdominal peritoneal TAP blocks were done as well.  I focused attention on the LEFT pelvis since that was the dominant hernia side.   I used blunt & focused sharp dissection to free the peritoneum off the flank and down to the pubic rim.  I freed the anteriolateral bladder wall off the anteriolateral pelvic wall, sparing midline attachments.   I located a swath of peritoneum going into a hernia fascial defect at the  internal ring as well as the direct space consistent with  an indirect direct space pantaloon inguinal hernias.  I gradually freed the peritoneal hernia sac off safely and reduced it into the preperitoneal space.  I freed the peritoneum off the spermatic vessels & vas deferens.  I freed peritoneum off the retroperitoneum along the psoas muscle.  This is an obvious femoral hernia as well.  Obturator foramen appear to be normal.  spermatic cord lipoma was dissected away & removed.  I checked & assured hemostasis.     I turned attention on the opposite  RIGHT pelvis.  I did dissection in a similar, mirror-image fashion. The patient had a femoral hernia.  No direct space nor inguinal hernias.  Definite but small obturator hernia as well.    I checked & assured hemostasis.     She is medium weight Bard mesh.  15x15 cm sheets of Bard standard weight polypropylene mesh, one for each side.  I cut a single sigmoid-shaped slit ~6cm from a  corner of each mesh.  I placed the meshes into the preperitoneal space & laid them as overlapping diamonds such that at the inferior points, a 6x6 cm corner flap rested in the true anterolateral pelvis, covering the obturator & femoral foramina.   I allowed the bladder to return to the pubis, this helping tuck the corners of the mesh in the anteriolateral pelvis.  The medial corners overlapped each other across midline cephalad to the pubic rim.   Given the numerous hernias of moderate size, I placed a third 15x15cm mesh in the center as a vertical diamond.  The lateral wings of the mesh overlap across the direct spaces and internal rings where the dominant hernias were.  This provided good coverage and reinforcement of the hernia repairs.  Because of the central mesh placement with good overlap, I did not place any tacks.   I held the hernia sacs cephalad & evacuated carbon dioxide.  I closed the fascia with absorbable suture.  I closed the skin using 4-0 monocryl stitch.  Sterile dressings were applied.   The patient was extubated & arrived in the PACU in stable condition..  I had discussed postoperative care with the patient in the holding area.  Instructions are written in the chart.  I discussed operative findings, updated the patient's status, discussed  probable steps to recovery, and gave postoperative recommendations to the patient's spouse.  Recommendations were made.  Questions were answered.  She expressed understanding & appreciation.   [name], M.D., F.A.C.S. Gastrointestinal and Minimally Invasive Surgery [facility] [address] [place],  [address] [phone] Main / Paging  12/15/2017 12:22 PM

## 2017-12-15 NOTE — Discharge Instructions (Signed)
Post Anesthesia Home Care Instructions  Activity: Get plenty of rest for the remainder of the day. A responsible individual must stay with you for 24 hours following the procedure.  For the next 24 hours, DO NOT: -Drive a car -Paediatric nurse -Drink alcoholic beverages -Take any medication unless instructed by your physician -Make any legal decisions or sign important papers.  Meals: Start with liquid foods such as gelatin or soup. Progress to regular foods as tolerated. Avoid greasy, spicy, heavy foods. If nausea and/or vomiting occur, drink only clear liquids until the nausea and/or vomiting subsides. Call your physician if vomiting continues.  Special Instructions/Symptoms: Your throat may feel dry or sore from the anesthesia or the breathing tube placed in your throat during surgery. If this causes discomfort, gargle with warm salt water. The discomfort should disappear within 24 hours.  If you had a scopolamine patch placed behind your ear for the management of post- operative nausea and/or vomiting:  1. The medication in the patch is effective for 72 hours, after which it should be removed.  Wrap patch in a tissue and discard in the trash. Wash hands thoroughly with soap and water. 2. You may remove the patch earlier than 72 hours if you experience unpleasant side effects which may include dry mouth, dizziness or visual disturbances. 3. Avoid touching the patch. Wash your hands with soap and water after contact with the patch.   HERNIA REPAIR: POST OP INSTRUCTIONS  ######################################################################  EAT Gradually transition to a high fiber diet with a fiber supplement over the next few weeks after discharge.  Start with a pureed / full liquid diet (see below)  WALK Walk an hour a day.  Control your pain to do that.    CONTROL PAIN Control pain so that you can walk, sleep, tolerate sneezing/coughing, and go up/down stairs.  HAVE A  BOWEL MOVEMENT DAILY Keep your bowels regular to avoid problems.  OK to try a laxative to override constipation.  OK to use an antidairrheal to slow down diarrhea.  Call if not better after 2 tries  CALL IF YOU HAVE PROBLEMS/CONCERNS Call if you are still struggling despite following these instructions. Call if you have concerns not answered by these instructions  ######################################################################    1. DIET: Follow a light bland diet the first 24 hours after arrival home, such as soup, liquids, crackers, etc.  Be sure to include lots of fluids daily.  Advance to a low fat / high fiber diet over the next few days after surgery.  Avoid fast food or heavy meals the first week as your are more likely to get nauseated.    2. Take your usually prescribed home medications unless otherwise directed.  3. PAIN CONTROL: a. Pain is best controlled by a usual combination of three different methods TOGETHER: i. Ice/Heat ii. Over the counter pain medication iii. Prescription pain medication b. Most patients will experience some swelling and bruising around the hernia(s) such as the bellybutton, groins, or old incisions.  Ice packs or heating pads (30-60 minutes up to 6 times a day) will help. Use ice for the first few days to help decrease swelling and bruising, then switch to heat to help relax tight/sore spots and speed recovery.  Some people prefer to use ice alone, heat alone, alternating between ice & heat.  Experiment to what works for you.  Swelling and bruising can take several weeks to resolve.   c. It is helpful to take an over-the-counter pain medication regularly  for the first few weeks.  Choose one of the following that works best for you: i. Naproxen (Aleve, etc)  Two 220mg  tabs twice a day ii. Ibuprofen (Advil, etc) Three 200mg  tabs four times a day (every meal & bedtime) iii. Acetaminophen (Tylenol, etc) 325-650mg  four times a day (every meal &  bedtime) d. A  prescription for pain medication should be given to you upon discharge.  Take your pain medication as prescribed.  i. If you are having problems/concerns with the prescription medicine (does not control pain, nausea, vomiting, rash, itching, etc), please call us [phone] to see if we need to switch you to a different pain medicine that will work better for you and/or control your side effect better. ii. If you need a refill on your pain medication, please contact your pharmacy.  They will contact our office to request authorization. Prescriptions will not be filled after 5 pm or on week-ends.  4. Avoid getting constipated.  Between the surgery and the pain medications, it is common to experience some constipation.  Increasing fluid intake and taking a fiber supplement (such as Metamucil, Citrucel, FiberCon, MiraLax, etc) 1-2 times a day regularly will usually help prevent this problem from occurring.  A mild laxative (prune juice, Milk of Magnesia, MiraLax, etc) should be taken according to package directions if there are no bowel movements after 48 hours.    5. Wash / shower every day.  You may shower over the dressings as they are waterproof.    6. Remove your waterproof bandages, skin tapes, and other bandages 5 days after surgery. You may replace a dressing/Band-Aid to cover the incision for comfort if you wish. You may leave the incisions open to air.  You may replace a dressing/Band-Aid to cover an incision for comfort if you wish.  Continue to shower over incision(s) after the dressing is off.  7. ACTIVITIES as tolerated:   a. You may resume regular (light) daily activities beginning the next day--such as daily self-care, walking, climbing stairs--gradually increasing activities as tolerated.  Control your pain so that you can walk an hour a day.  If you can walk 30 minutes without difficulty, it is safe to try more intense activity such as jogging, treadmill, bicycling,  low-impact aerobics, swimming, etc. b. Save the most intensive and strenuous activity for last such as sit-ups, heavy lifting, contact sports, etc  Refrain from any heavy lifting or straining until you are off narcotics for pain control.   c. DO NOT PUSH THROUGH PAIN.  Let pain be your guide: If it hurts to do something, don't do it.  Pain is your body warning you to avoid that activity for another week until the pain goes down. d. You may drive when you are no longer taking prescription pain medication, you can comfortably wear a seatbelt, and you can safely maneuver your car and apply brakes. e. Dennis Bast may have sexual intercourse when it is comfortable.   8. FOLLOW UP in our office a. Please call [facility] at [phone] to set up an appointment to see your surgeon in the office for a follow-up appointment approximately 2-3 weeks after your surgery. b. Make sure that you call for this appointment the day you arrive home to insure a convenient appointment time.  9.  If you have disability of FMLA / Family leave forms, please bring the forms to the office for processing.  (do not give to your surgeon).  WHEN TO CALL us [phone]: 1.  Poor pain control 2. Reactions / problems with new medications (rash/itching, nausea, etc)  3. Fever over 101.5 F (38.5 C) 4. Inability to urinate 5. Nausea and/or vomiting 6. Worsening swelling or bruising 7. Continued bleeding from incision. 8. Increased pain, redness, or drainage from the incision   The clinic staff is available to answer your questions during regular business hours (8:30am-5pm).  Please dont hesitate to call and ask to speak to one of our nurses for clinical concerns.   If you have a medical emergency, go to the nearest emergency room or call 911.  A surgeon from [facility] is always on call at the hospitals in [facility], [place], [place], [place], [place]  [address] ?  [address]  [address], [place], [place]   [address] MAIN: [phone] ? TOLL FREE: [phone] ? FAX: [phone] [email or web address]

## 2017-12-15 NOTE — Anesthesia Postprocedure Evaluation (Signed)
Anesthesia Post Note  Patient: Laurie Elza  Procedure(s) Performed: LAPAROSCOPIC BILATERAL FEMORAL HERNIA, RIGHT OBTUATOR HERNIA, AND RECURRENT LEFT HERNIA REPAIR, BILATERAL TAP BLOCK INSERTION OF MESH     Patient location during evaluation: PACU Anesthesia Type: General Level of consciousness: awake and alert and oriented Pain management: pain level controlled Vital Signs Assessment: post-procedure vital signs reviewed and stable Respiratory status: spontaneous breathing, nonlabored ventilation and respiratory function stable Cardiovascular status: blood pressure returned to baseline and stable Postop Assessment: no apparent nausea or vomiting Anesthetic complications: no    Last Vitals:  Vitals:   12/15/17 1340 12/15/17 1345  BP: (!) 146/86   Pulse: 62 63  Resp: 13 14  Temp: 36.4 C   SpO2: 99%     Last Pain:  Vitals:   12/15/17 1330  TempSrc:   PainSc: 0-No pain                 Aalyiah Camberos A.

## 2017-12-15 NOTE — Interval H&P Note (Signed)
History and Physical Interval Note:  12/15/2017 11:01 AM  Jim Rowland  has presented today for surgery, with the diagnosis of Recurrent left inguinal hernia  Possible right inguinal hernia.  The various methods of treatment have been discussed with the patient and family. After consideration of risks, benefits and other options for treatment, the patient has consented to  Procedure(s): LAPAROSCOPIC LEFT INGUINAL HERNIA POSSIBLE RIGHT REPAIR WITH MESH (Left) INSERTION OF MESH (Left) as a surgical intervention .  The patient's history has been reviewed, patient examined, no change in status, stable for surgery.  I have reviewed the patient's chart and labs.  Questions were answered to the patient's satisfaction.    I have re-reviewed the the patient's records, history, medications, and allergies.  I have re-examined the patient.  I again discussed intraoperative plans and goals of post-operative recovery.  The patient agrees to proceed.  Jim Rowland  Oct 12, 1950 [id number]  Patient Care Team: Kathyrn Lass, MD as PCP - General (Family Medicine) Michael Boston, MD as Consulting Physician (General Surgery)  Patient Active Problem List   Diagnosis Date Noted  . Recurrent left inguinal hernia 12/06/2017  . HTN (hypertension) 12/06/2017    Past Medical History:  Diagnosis Date  . HLD (hyperlipidemia)   . Hypertension   . Inflammation of lung    years ago in 1979 ; was told that his taking of hot showers the morning nd going out into cold weather caused inflmamtion in his lungs and "left marks " therre. he reports being asymptomatic   . Inguinal hernia     Past Surgical History:  Procedure Laterality Date  . INGUINAL HERNIA REPAIR Left 1955    Social History   Socioeconomic History  . Marital status: Married    Spouse name: Not on file  . Number of children: Not on file  . Years of education: Not on file  . Highest education level: Not on file  Occupational History  . Not on file   Social Needs  . Financial resource strain: Not on file  . Food insecurity:    Worry: Not on file    Inability: Not on file  . Transportation needs:    Medical: Not on file    Non-medical: Not on file  Tobacco Use  . Smoking status: Former Smoker    Types: Cigarettes    Last attempt to quit: 12/10/2003    Years since quitting: 14.0  . Smokeless tobacco: Never Used  Substance and Sexual Activity  . Alcohol use: Yes    Alcohol/week: 6.0 oz    Types: 10 Cans of beer per week  . Drug use: Never  . Sexual activity: Not on file  Lifestyle  . Physical activity:    Days per week: Not on file    Minutes per session: Not on file  . Stress: Not on file  Relationships  . Social connections:    Talks on phone: Not on file    Gets together: Not on file    Attends religious service: Not on file    Active member of club or organization: Not on file    Attends meetings of clubs or organizations: Not on file    Relationship status: Not on file  . Intimate partner violence:    Fear of current or ex partner: Not on file    Emotionally abused: Not on file    Physically abused: Not on file    Forced sexual activity: Not on file  Other Topics Concern  .  Not on file  Social History Narrative   Originally from [place].      In [place] since 1997    History reviewed. No pertinent family history.  Medications Prior to Admission  Medication Sig Dispense Refill Last Dose  . atorvastatin (LIPITOR) 10 MG tablet Take 10 mg by mouth daily.   12/14/2017 at Unknown time  . ibuprofen (ADVIL,MOTRIN) 200 MG tablet Take 400 mg by mouth every 6 (six) hours as needed for mild pain or moderate pain.   Past Week at Unknown time  . losartan-hydrochlorothiazide (HYZAAR) 100-25 MG tablet Take 1 tablet by mouth daily.   12/14/2017 at Unknown time    Current Facility-Administered Medications  Medication Dose Route Frequency Provider Last Rate Last Dose  . bupivacaine liposome (EXPAREL) 1.3 % injection 266  mg  20 mL Infiltration Once Michael Boston, MD      . Chlorhexidine Gluconate Cloth 2 % PADS 6 each  6 each Topical Once Michael Boston, MD       And  . Chlorhexidine Gluconate Cloth 2 % PADS 6 each  6 each Topical Once Michael Boston, MD      . lactated ringers infusion   Intravenous Continuous Josephine Igo, MD 20 mL/hr at 12/15/17 0914     Facility-Administered Medications Ordered in Other Encounters  Medication Dose Route Frequency Provider Last Rate Last Dose  . fentaNYL (SUBLIMAZE) injection    Anesthesia Intra-op Claudia Desanctis, CRNA   100 mcg at 12/15/17 1047  . lidocaine 2% (20 mg/mL) 5 mL syringe   Intravenous Anesthesia Intra-op Claudia Desanctis, CRNA   50 mg at 12/15/17 1047  . midazolam (VERSED) 5 MG/5ML injection    Anesthesia Intra-op Claudia Desanctis, CRNA   2 mg at 12/15/17 1039     No Known Allergies  BP (!) 152/85   Pulse 77   Temp (!) 97.5 F (36.4 C) (Oral)   Resp 16   Ht 5\' 7"  (1.702 m)   Wt 79.4 kg (175 lb)   SpO2 99%   BMI 27.41 kg/m    General: Pt awake/alert/oriented x4 in no major acute distress Eyes: PERRL, normal EOM. Sclera nonicteric Neuro: CN II-XII intact w/o focal sensory/motor deficits. Lymph: No head/neck/groin lymphadenopathy Psych:  No delerium/psychosis/paranoia HENT: Normocephalic, Mucus membranes moist.  No thrush Neck: Supple, No tracheal deviation Chest: No pain.  Good respiratory excursion. CV:  Pulses intact.  Regular rhythm MS: Normal AROM mjr joints.  No obvious deformity Abdomen: Soft, Nondistended.  Nontender.  No incarcerated hernias. GU:  LIH,  ?small RIH as well.  NEMG Ext:  SCDs BLE.  No significant edema.  No cyanosis Skin: No petechiae / purpura   Labs: No results found for this or any previous visit (from the past 48 hour(s)).  Imaging / Studies: No results found.   Jim Rowland, M.D., F.A.C.S. Gastrointestinal and Minimally Invasive Surgery [facility] [address]  [place], [place] [address] [phone] Main / Paging  12/15/2017 11:01 AM     Jim Rowland

## 2017-12-16 ENCOUNTER — Encounter (HOSPITAL_COMMUNITY): Payer: Self-pay | Admitting: Surgery

## 2020-04-02 ENCOUNTER — Ambulatory Visit (INDEPENDENT_AMBULATORY_CARE_PROVIDER_SITE_OTHER): Payer: Medicare Other | Admitting: Cardiology

## 2020-04-02 ENCOUNTER — Encounter: Payer: Self-pay | Admitting: Cardiology

## 2020-04-02 VITALS — BP 154/98 | HR 71 | Ht 67.0 in | Wt 183.6 lb

## 2020-04-02 DIAGNOSIS — I10 Essential (primary) hypertension: Secondary | ICD-10-CM

## 2020-04-02 DIAGNOSIS — E785 Hyperlipidemia, unspecified: Secondary | ICD-10-CM

## 2020-04-02 DIAGNOSIS — R079 Chest pain, unspecified: Secondary | ICD-10-CM

## 2020-04-02 MED ORDER — AMLODIPINE BESYLATE 5 MG PO TABS: 5.0000 mg | ORAL_TABLET | Freq: Every day | ORAL | 3 refills | Status: DC

## 2020-04-02 NOTE — Progress Notes (Signed)
Cardiology Office Note:    Date:  04/03/2020   ID:  Eliberto Sole, DOB Oct 06, 1950, MRN [id number]  PCP:  Kathyrn Lass, MD  Cardiologist:  No primary care provider on file.  Electrophysiologist:  None   Referring MD: Lawerance Cruel, MD   Chief Complaint  Patient presents with  . Chest Pain    History of Present Illness:    Jim Rowland is a 69 y.o. male with a hx of hypertension, hyperlipidemia, former tobacco use is referred by Dr. Harrington Challenger evaluation of chest pain.  He reports that last week he was having pain in his upper abdomen/lower chest that he described as burning.  Reports pain has improved since that time.  No clear relationship with exertion.  Reports he walks 6 miles per day.  He denies any shortness of breath, lightheadedness, syncope, or lower extremity edema.  Does not check BP at home.  Reports he smoked for 35 years up to 2 pack/day but quit in 2005.  Family history includes father had MI in 1s and mother had MI in 63s.   Past Medical History:  Diagnosis Date  . HLD (hyperlipidemia)   . Hypertension   . Inflammation of lung    years ago in 1979 ; was told that his taking of hot showers the morning nd going out into cold weather caused inflmamtion in his lungs and "left marks " therre. he reports being asymptomatic   . Inguinal hernia     Past Surgical History:  Procedure Laterality Date  . INGUINAL HERNIA REPAIR Left 1955  . INGUINAL HERNIA REPAIR  12/15/2017   Procedure: LAPAROSCOPIC BILATERAL FEMORAL HERNIA, RIGHT OBTUATOR HERNIA, AND RECURRENT LEFT HERNIA REPAIR, BILATERAL TAP BLOCK;  Surgeon: Michael Boston, MD;  Location: [facility];  Service: General;;  . INSERTION OF MESH  12/15/2017   Procedure: INSERTION OF MESH;  Surgeon: Michael Boston, MD;  Location: [facility];  Service: General;;    Current Medications: Current Meds  Medication Sig  . atorvastatin (LIPITOR) 10 MG tablet Take 10 mg by mouth daily.  Marland Kitchen ibuprofen (ADVIL,MOTRIN) 200 MG tablet Take 400 mg by  mouth every 6 (six) hours as needed for mild pain or moderate pain.  Marland Kitchen losartan-hydrochlorothiazide (HYZAAR) 100-25 MG tablet Take 1 tablet by mouth daily.  . traMADol (ULTRAM) 50 MG tablet Take 1-2 tablets (50-100 mg total) by mouth every 6 (six) hours as needed for moderate pain or severe pain.     Allergies:   Patient has no known allergies.   Social History   Socioeconomic History  . Marital status: Married    Spouse name: Not on file  . Number of children: Not on file  . Years of education: Not on file  . Highest education level: Not on file  Occupational History  . Not on file  Tobacco Use  . Smoking status: Former Smoker    Types: Cigarettes    Quit date: 12/10/2003    Years since quitting: 16.3  . Smokeless tobacco: Never Used  Vaping Use  . Vaping Use: Never used  Substance and Sexual Activity  . Alcohol use: Yes    Alcohol/week: 10.0 standard drinks    Types: 10 Cans of beer per week  . Drug use: Never  . Sexual activity: Not on file  Other Topics Concern  . Not on file  Social History Narrative   Originally from [place].      In [place] since [place]  Resource Strain:   . Difficulty of Paying Living Expenses: Not on file  Food Insecurity:   . Worried About Charity fundraiser in the Last Year: Not on file  . Ran Out of Food in the Last Year: Not on file  Transportation Needs:   . Lack of Transportation (Medical): Not on file  . Lack of Transportation (Non-Medical): Not on file  Physical Activity:   . Days of Exercise per Week: Not on file  . Minutes of Exercise per Session: Not on file  Stress:   . Feeling of Stress : Not on file  Social Connections:   . Frequency of Communication with Friends and Family: Not on file  . Frequency of Social Gatherings with Friends and Family: Not on file  . Attends Religious Services: Not on file  . Active Member of Clubs or Organizations: Not on file  . Attends Theatre manager Meetings: Not on file  . Marital Status: Not on file     Family History: Family history includes father had MI in 32s and mother had MI in 74s.  ROS:   Please see the history of present illness.     All other systems reviewed and are negative.  EKGs/Labs/Other Studies Reviewed:    The following studies were reviewed today:   EKG:  EKG is  ordered today.  The ekg ordered today demonstrates normal sinus rhythm, rate 71, no ST abnormalities  Recent Labs: No results found for requested labs within last 8760 hours.  Recent Lipid Panel    Component Value Date/Time   CHOL 151 04/02/2020 1434   TRIG 203 (H) 04/02/2020 1434   HDL 38 (L) 04/02/2020 1434   CHOLHDL 4.0 04/02/2020 1434   LDLCALC 79 04/02/2020 1434    Physical Exam:    VS:  BP (!) 154/98   Pulse 71   Ht 5\' 7"  (1.702 m)   Wt 183 lb 9.6 oz (83.3 kg)   SpO2 97%   BMI 28.76 kg/m     Wt Readings from Last 3 Encounters:  04/02/20 183 lb 9.6 oz (83.3 kg)  12/15/17 175 lb (79.4 kg)  12/09/17 175 lb (79.4 kg)     GEN:  Well nourished, well developed in no acute distress HEENT: Normal NECK: No JVD; No carotid bruits LYMPHATICS: No lymphadenopathy CARDIAC: RRR, no murmurs, rubs, gallops RESPIRATORY:  Clear to auscultation without rales, wheezing or rhonchi  ABDOMEN: Soft, non-tender, non-distended MUSCULOSKELETAL:  No edema; No deformity  SKIN: Warm and dry NEUROLOGIC:  Alert and oriented x 3 PSYCHIATRIC:  Normal affect   ASSESSMENT:    1. Chest pain of uncertain etiology   2. Essential hypertension   3. Hyperlipidemia, unspecified hyperlipidemia type    PLAN:    Chest pain: Atypical in description but does have significant CAD risk factors (hypertension, hyperlipidemia, tobacco use, family history).  Will evaluate for ischemia with exercise Myoview.  Hyperlipidemia: On atorvastatin 10 mg daily.  Will check lipid panel.  Will check calcium score to guide how aggressive to be in lowering  cholesterol.  Hypertension: On losartan-HCTZ 100-25 mg daily.  BP elevated.  Will add amlodipine 5 mg daily.  Asked patient to check BP twice daily for next 2 weeks and call with results.  RTC in 3 months   Medication Adjustments/Labs and Tests Ordered: Current medicines are reviewed at length with the patient today.  Concerns regarding medicines are outlined above.  Orders Placed This Encounter  Procedures  . CT CARDIAC SCORING  .  Lipid panel  . MYOCARDIAL PERFUSION IMAGING  . EKG 12-Lead   Meds ordered this encounter  Medications  . amLODipine (NORVASC) 5 MG tablet    Sig: Take 1 tablet (5 mg total) by mouth daily.    Dispense:  90 tablet    Refill:  3    Patient Instructions  Medication Instructions:  START amlodipine 5 mg daily  *If you need a refill on your cardiac medications before your next appointment, please call your pharmacy*   Lab Work: Lipid today  If you have labs (blood work) drawn today and your tests are completely normal, you will receive your results only by: Marland Kitchen MyChart Message (if you have MyChart) OR . A paper copy in the mail If you have any lab test that is abnormal or we need to change your treatment, we will call you to review the results.   Testing/Procedures: Your physician has requested that you have an exercise stress myoview. For further information please visit [email or web address]. Please follow instruction sheet, as given. --this test requires a covid screening 3 days before, we will schedule this for you  CT coronary calcium score. This test is done at [address]. This is $150 out of pocket.   Coronary CalciumScan A coronary calcium scan is an imaging test used to look for deposits of calcium and other fatty materials (plaques) in the inner lining of the blood vessels of the heart (coronary arteries). These deposits of calcium and plaques can partly clog and narrow the coronary arteries without producing any  symptoms or warning signs. This puts a person at risk for a heart attack. This test can detect these deposits before symptoms develop. Tell a health care provider about:  Any allergies you have.  All medicines you are taking, including vitamins, herbs, eye drops, creams, and over-the-counter medicines.  Any problems you or family members have had with anesthetic medicines.  Any blood disorders you have.  Any surgeries you have had.  Any medical conditions you have.  Whether you are pregnant or may be pregnant. What are the risks? Generally, this is a safe procedure. However, problems may occur, including:  Harm to a pregnant woman and her unborn baby. This test involves the use of radiation. Radiation exposure can be dangerous to a pregnant woman and her unborn baby. If you are pregnant, you generally should not have this procedure done.  Slight increase in the risk of cancer. This is because of the radiation involved in the test. What happens before the procedure? No preparation is needed for this procedure. What happens during the procedure?  You will undress and remove any jewelry around your neck or chest.  You will put on a hospital gown.  Sticky electrodes will be placed on your chest. The electrodes will be connected to an electrocardiogram (ECG) machine to record a tracing of the electrical activity of your heart.  A CT scanner will take pictures of your heart. During this time, you will be asked to lie still and hold your breath for 2-3 seconds while a picture of your heart is being taken. The procedure may vary among health care providers and hospitals. What happens after the procedure?  You can get dressed.  You can return to your normal activities.  It is up to you to get the results of your test. Ask your health care provider, or the department that is doing the test, when your results will be ready. Summary  A coronary  calcium scan is an imaging test used to  look for deposits of calcium and other fatty materials (plaques) in the inner lining of the blood vessels of the heart (coronary arteries).  Generally, this is a safe procedure. Tell your health care provider if you are pregnant or may be pregnant.  No preparation is needed for this procedure.  A CT scanner will take pictures of your heart.  You can return to your normal activities after the scan is done. This information is not intended to replace advice given to you by your health care provider. Make sure you discuss any questions you have with your health care provider. Document Released: 11/06/2007 Document Revised: 03/29/2016 Document Reviewed: 03/29/2016 Elsevier Interactive Patient Education  2017 [place]: At [facility], you and your health needs are our priority.  As part of our continuing mission to provide you with exceptional heart care, we have created designated Provider Care Teams.  These Care Teams include your primary Cardiologist (physician) and Advanced Practice Providers (APPs -  Physician Assistants and Nurse Practitioners) who all work together to provide you with the care you need, when you need it.  We recommend signing up for the patient portal called "MyChart".  Sign up information is provided on this After Visit Summary.  MyChart is used to connect with patients for Virtual Visits (Telemedicine).  Patients are able to view lab/test results, encounter notes, upcoming appointments, etc.  Non-urgent messages can be sent to your provider as well.   To learn more about what you can do with MyChart, go to [email or web address].    Your next appointment:   3 month(s)  The format for your next appointment:   In Person  Provider:   Oswaldo Milian, MD   Other Instructions Please check your blood pressure at home daily, write it down.  Call the office or send message via Mychart with the readings in 2 weeks for Dr. Gardiner Rhyme to review.        Signed, Donato Heinz, MD  04/03/2020 2:24 PM    [place]

## 2020-04-02 NOTE — Patient Instructions (Signed)
Medication Instructions:  START amlodipine 5 mg daily  *If you need a refill on your cardiac medications before your next appointment, please call your pharmacy*   Lab Work: Lipid today  If you have labs (blood work) drawn today and your tests are completely normal, you will receive your results only by: Marland Kitchen MyChart Message (if you have MyChart) OR . A paper copy in the mail If you have any lab test that is abnormal or we need to change your treatment, we will call you to review the results.   Testing/Procedures: Your physician has requested that you have an exercise stress myoview. For further information please visit [email or web address]. Please follow instruction sheet, as given. --this test requires a covid screening 3 days before, we will schedule this for you  CT coronary calcium score. This test is done at [address]. This is $150 out of pocket.   Coronary CalciumScan A coronary calcium scan is an imaging test used to look for deposits of calcium and other fatty materials (plaques) in the inner lining of the blood vessels of the heart (coronary arteries). These deposits of calcium and plaques can partly clog and narrow the coronary arteries without producing any symptoms or warning signs. This puts a person at risk for a heart attack. This test can detect these deposits before symptoms develop. Tell a health care provider about:  Any allergies you have.  All medicines you are taking, including vitamins, herbs, eye drops, creams, and over-the-counter medicines.  Any problems you or family members have had with anesthetic medicines.  Any blood disorders you have.  Any surgeries you have had.  Any medical conditions you have.  Whether you are pregnant or may be pregnant. What are the risks? Generally, this is a safe procedure. However, problems may occur, including:  Harm to a pregnant woman and her unborn baby. This test involves the use of radiation.  Radiation exposure can be dangerous to a pregnant woman and her unborn baby. If you are pregnant, you generally should not have this procedure done.  Slight increase in the risk of cancer. This is because of the radiation involved in the test. What happens before the procedure? No preparation is needed for this procedure. What happens during the procedure?  You will undress and remove any jewelry around your neck or chest.  You will put on a hospital gown.  Sticky electrodes will be placed on your chest. The electrodes will be connected to an electrocardiogram (ECG) machine to record a tracing of the electrical activity of your heart.  A CT scanner will take pictures of your heart. During this time, you will be asked to lie still and hold your breath for 2-3 seconds while a picture of your heart is being taken. The procedure may vary among health care providers and hospitals. What happens after the procedure?  You can get dressed.  You can return to your normal activities.  It is up to you to get the results of your test. Ask your health care provider, or the department that is doing the test, when your results will be ready. Summary  A coronary calcium scan is an imaging test used to look for deposits of calcium and other fatty materials (plaques) in the inner lining of the blood vessels of the heart (coronary arteries).  Generally, this is a safe procedure. Tell your health care provider if you are pregnant or may be pregnant.  No preparation is needed for this  procedure.  A CT scanner will take pictures of your heart.  You can return to your normal activities after the scan is done. This information is not intended to replace advice given to you by your health care provider. Make sure you discuss any questions you have with your health care provider. Document Released: 11/06/2007 Document Revised: 03/29/2016 Document Reviewed: 03/29/2016 Elsevier Interactive Patient Education   2017 [place]: At [facility], you and your health needs are our priority.  As part of our continuing mission to provide you with exceptional heart care, we have created designated Provider Care Teams.  These Care Teams include your primary Cardiologist (physician) and Advanced Practice Providers (APPs -  Physician Assistants and Nurse Practitioners) who all work together to provide you with the care you need, when you need it.  We recommend signing up for the patient portal called "MyChart".  Sign up information is provided on this After Visit Summary.  MyChart is used to connect with patients for Virtual Visits (Telemedicine).  Patients are able to view lab/test results, encounter notes, upcoming appointments, etc.  Non-urgent messages can be sent to your provider as well.   To learn more about what you can do with MyChart, go to [email or web address].    Your next appointment:   3 month(s)  The format for your next appointment:   In Person  Provider:   Oswaldo Milian, MD   Other Instructions Please check your blood pressure at home daily, write it down.  Call the office or send message via Mychart with the readings in 2 weeks for Dr. Gardiner Rhyme to review.

## 2020-04-03 LAB — LIPID PANEL
Chol/HDL Ratio: 4 ratio (ref 0.0–5.0)
Cholesterol, Total: 151 mg/dL (ref 100–199)
HDL: 38 mg/dL — ABNORMAL LOW (ref >39)
LDL Chol Calc (NIH): 79 mg/dL (ref 0–99)
Triglycerides: 203 mg/dL — ABNORMAL HIGH (ref 0–149)
VLDL Cholesterol Cal: 34 mg/dL (ref 5–40)

## 2020-04-04 ENCOUNTER — Telehealth (HOSPITAL_COMMUNITY): Payer: Self-pay | Admitting: *Deleted

## 2020-04-04 ENCOUNTER — Other Ambulatory Visit (HOSPITAL_COMMUNITY)
Admission: RE | Admit: 2020-04-04 | Discharge: 2020-04-04 | Disposition: A | Discharge status: Home / Self Care | Payer: Medicare Other | Source: Ambulatory Visit | Attending: Cardiology | Admitting: Cardiology

## 2020-04-04 DIAGNOSIS — Z20822 Contact with and (suspected) exposure to covid-19: Secondary | ICD-10-CM | POA: Diagnosis not present

## 2020-04-04 DIAGNOSIS — Z01812 Encounter for preprocedural laboratory examination: Secondary | ICD-10-CM | POA: Insufficient documentation

## 2020-04-04 LAB — SARS CORONAVIRUS 2 (TAT 6-24 HRS): SARS Coronavirus 2: NEGATIVE

## 2020-04-04 NOTE — Telephone Encounter (Signed)
Close encounter 

## 2020-04-08 ENCOUNTER — Ambulatory Visit (HOSPITAL_COMMUNITY)
Admission: RE | Admit: 2020-04-08 | Discharge: 2020-04-08 | Disposition: A | Discharge status: Home / Self Care | Payer: Medicare Other | Source: Ambulatory Visit | Attending: Cardiovascular Disease | Admitting: Cardiovascular Disease

## 2020-04-08 ENCOUNTER — Other Ambulatory Visit: Payer: Self-pay

## 2020-04-08 DIAGNOSIS — R079 Chest pain, unspecified: Secondary | ICD-10-CM | POA: Insufficient documentation

## 2020-04-08 LAB — MYOCARDIAL PERFUSION IMAGING
Estimated workload: 10.1 METS
Exercise duration (min): 8 min
Exercise duration (sec): 1 s
LV dias vol: 76 mL (ref 62–150)
LV sys vol: 27 mL
MPHR: 151 {beats}/min
Peak HR: 139 {beats}/min
Percent HR: 92 %
Rest HR: 55 {beats}/min
SDS: 4
SRS: 0
SSS: 4
TID: 0.75

## 2020-04-08 MED ORDER — TECHNETIUM TC 99M TETROFOSMIN IV KIT
10.8000 | PACK | Freq: Once | INTRAVENOUS | Status: AC | PRN
  Administered 2020-04-08: 10.8 via INTRAVENOUS
  Filled 2020-04-08: qty 11

## 2020-04-08 MED ORDER — TECHNETIUM TC 99M TETROFOSMIN IV KIT
30.1000 | PACK | Freq: Once | INTRAVENOUS | Status: AC | PRN
  Administered 2020-04-08: 30.1 via INTRAVENOUS
  Filled 2020-04-08: qty 31

## 2020-04-24 ENCOUNTER — Inpatient Hospital Stay: Admission: RE | Admit: 2020-04-24 | Payer: Medicare Other | Source: Ambulatory Visit

## 2020-05-27 ENCOUNTER — Ambulatory Visit (INDEPENDENT_AMBULATORY_CARE_PROVIDER_SITE_OTHER)
Admission: RE | Admit: 2020-05-27 | Discharge: 2020-05-27 | Disposition: A | Discharge status: Home / Self Care | Payer: Self-pay | Source: Ambulatory Visit | Attending: Cardiology | Admitting: Cardiology

## 2020-05-27 ENCOUNTER — Other Ambulatory Visit: Payer: Self-pay

## 2020-05-27 DIAGNOSIS — R079 Chest pain, unspecified: Secondary | ICD-10-CM

## 2020-05-27 DIAGNOSIS — E785 Hyperlipidemia, unspecified: Secondary | ICD-10-CM

## 2020-06-05 ENCOUNTER — Telehealth: Payer: Self-pay | Admitting: Cardiology

## 2020-06-05 MED ORDER — ATORVASTATIN CALCIUM 20 MG PO TABS: 20.0000 mg | ORAL_TABLET | Freq: Every day | ORAL | 3 refills | Status: DC

## 2020-06-05 NOTE — Telephone Encounter (Signed)
Advised pt of calcium score results. Pt verbalizes understanding and agrees to increase atorvastatin to 20 mg by mouth once daily. Rx sent to pharmacy.   Additionally, reviewed finding of small lung nodule with pt. Pt states that he is aware of the nodule. He notes that 41 years ago had severe lung inflammation and was followed by a pulmonologist in [place]. Pt states that the nodule has been biopsied in the past and was found to be benign. No treatment recommended at that time. Pt was advised to follow back up for any respiratory complaints. Pt states he is not interested in having the scan ordered for annual follow up at this time but is happy to discuss potential need for repeat scan with Dr. Gardiner Rhyme later on.

## 2020-06-05 NOTE — Telephone Encounter (Signed)
Patient and his wife are returning call to review his CT results.

## 2020-06-11 MED ORDER — ATORVASTATIN CALCIUM 20 MG PO TABS: 20.0000 mg | ORAL_TABLET | Freq: Every day | ORAL | 3 refills | Status: DC

## 2020-06-12 DIAGNOSIS — Z0000 Encounter for general adult medical examination without abnormal findings: Secondary | ICD-10-CM | POA: Diagnosis not present

## 2020-06-18 DIAGNOSIS — Z20822 Contact with and (suspected) exposure to covid-19: Secondary | ICD-10-CM | POA: Diagnosis not present

## 2020-07-06 NOTE — Progress Notes (Signed)
Cardiology Office Note:    Date:  07/07/2020   ID:  Jim Rowland, DOB 08-16-1950, MRN [id number]  PCP:  Kathyrn Lass, MD  Cardiologist:  No primary care provider on file.  Electrophysiologist:  None   Referring MD: Kathyrn Lass, MD   Chief Complaint  Patient presents with  . Chest Pain    History of Present Illness:    Jim Rowland is a 70 y.o. male with a hx of hypertension, hyperlipidemia, former tobacco use who presents for follow-up.  He was referred by Dr. Harrington Challenger evaluation of chest pain, initially seen 04/02/2020.  He reports that 1 week prior he was having pain in his upper abdomen/lower chest that he described as burning.  Reports pain has improved since that time.  No clear relationship with exertion.  Reports he walks 6 miles per day.  He denies any shortness of breath, lightheadedness, syncope, or lower extremity edema.  Does not check BP at home.  Reports he smoked for 35 years up to 2 pack/day but quit in 2005.  Family history includes father had MI in 29s and mother had MI in 55s.  Lexiscan Myoview on 04/08/2020 showed normal perfusion, EF 65%.  Calcium score on 05/27/2020 1290 (90th percentile), also noted to have dilated ascending aorta measuring 40 mm and 4 mm right middle lobe pulmonary nodule.  Since last clinic visit, he reports that he is doing well.  Denies any chest pain, dyspnea, lightheadedness, syncope, lower extremity edema, or palpitations.  Walks almost every day for about 3 miles.  No exertional symptoms.  BP at home 130-150s over 80s-90s.   Past Medical History:  Diagnosis Date  . HLD (hyperlipidemia)   . Hypertension   . Inflammation of lung    years ago in 1979 ; was told that his taking of hot showers the morning nd going out into cold weather caused inflmamtion in his lungs and "left marks " therre. he reports being asymptomatic   . Inguinal hernia     Past Surgical History:  Procedure Laterality Date  . INGUINAL HERNIA REPAIR Left 1955  . INGUINAL  HERNIA REPAIR  12/15/2017   Procedure: LAPAROSCOPIC BILATERAL FEMORAL HERNIA, RIGHT OBTUATOR HERNIA, AND RECURRENT LEFT HERNIA REPAIR, BILATERAL TAP BLOCK;  Surgeon: Michael Boston, MD;  Location: [facility];  Service: General;;  . INSERTION OF MESH  12/15/2017   Procedure: INSERTION OF MESH;  Surgeon: Michael Boston, MD;  Location: [facility];  Service: General;;    Current Medications: Current Meds  Medication Sig  . atorvastatin (LIPITOR) 20 MG tablet Take 1 tablet (20 mg total) by mouth daily.  Marland Kitchen ibuprofen (ADVIL,MOTRIN) 200 MG tablet Take 400 mg by mouth every 6 (six) hours as needed for mild pain or moderate pain.  Marland Kitchen losartan-hydrochlorothiazide (HYZAAR) 100-25 MG tablet Take 1 tablet by mouth daily.  . traMADol (ULTRAM) 50 MG tablet Take 1-2 tablets (50-100 mg total) by mouth every 6 (six) hours as needed for moderate pain or severe pain.  . [DISCONTINUED] amLODipine (NORVASC) 5 MG tablet Take 1 tablet (5 mg total) by mouth daily.     Allergies:   Patient has no known allergies.   Social History   Socioeconomic History  . Marital status: Married    Spouse name: Not on file  . Number of children: Not on file  . Years of education: Not on file  . Highest education level: Not on file  Occupational History  . Not on file  Tobacco Use  . Smoking status:  Former Smoker    Types: Cigarettes    Quit date: 12/10/2003    Years since quitting: 16.5  . Smokeless tobacco: Never Used  Vaping Use  . Vaping Use: Never used  Substance and Sexual Activity  . Alcohol use: Yes    Alcohol/week: 10.0 standard drinks    Types: 10 Cans of beer per week  . Drug use: Never  . Sexual activity: Not on file  Other Topics Concern  . Not on file  Social History Narrative   Originally from [place].      In [place] since 1997   Social Determinants of Health   Financial Resource Strain: Not on file  Food Insecurity: Not on file  Transportation Needs: Not on file  Physical Activity: Not on file   Stress: Not on file  Social Connections: Not on file     Family History: Family history includes father had MI in 56s and mother had MI in 4s.  ROS:   Please see the history of present illness.     All other systems reviewed and are negative.  EKGs/Labs/Other Studies Reviewed:    The following studies were reviewed today:   EKG:  EKG is not ordered today.  The ekg ordered at prior clinic visit demonstrates normal sinus rhythm, rate 71, no ST abnormalities  Recent Labs: No results found for requested labs within last 8760 hours.  Recent Lipid Panel    Component Value Date/Time   CHOL 151 04/02/2020 1434   TRIG 203 (H) 04/02/2020 1434   HDL 38 (L) 04/02/2020 1434   CHOLHDL 4.0 04/02/2020 1434   LDLCALC 79 04/02/2020 1434    Physical Exam:    VS:  BP (!) 160/92   Pulse 76   Ht 5\' 9"  (1.753 m)   Wt 183 lb (83 kg)   SpO2 96%   BMI 27.02 kg/m     Wt Readings from Last 3 Encounters:  07/07/20 183 lb (83 kg)  04/08/20 183 lb (83 kg)  04/02/20 183 lb 9.6 oz (83.3 kg)     GEN:  Well nourished, well developed in no acute distress HEENT: Normal NECK: No JVD; No carotid bruits LYMPHATICS: No lymphadenopathy CARDIAC: RRR, no murmurs, rubs, gallops RESPIRATORY:  Clear to auscultation without rales, wheezing or rhonchi  ABDOMEN: Soft, non-tender, non-distended MUSCULOSKELETAL:  No edema; No deformity  SKIN: Warm and dry NEUROLOGIC:  Alert and oriented x 3 PSYCHIATRIC:  Normal affect   ASSESSMENT:    1. Coronary artery disease involving native coronary artery of native heart, unspecified whether angina present   2. Aortic dilatation (HCC)   3. Essential hypertension   4. Hyperlipidemia, unspecified hyperlipidemia type   5. Pulmonary nodule    PLAN:    CAD: Reported atypical chest pain.  Lexiscan Myoview on 04/08/2020 showed normal perfusion, EF 65%.  Calcium score on 05/27/2020 1290 (90th percentile). -Continue atorvastatin 20 mg daily   Hyperlipidemia: LDL 79  on 04/02/2020 on atorvastatin 10 mg daily.  Atorvastatin increased to 20 mg daily  Hypertension: On losartan-HCTZ 100-25 mg daily and amlodipine 5 mg daily.  BP elevated, will increase to amlodipine 10 mg daily.  Asked patient to check BP twice daily for next 2 weeks and call with results.  Aortic dilatation: Ascending order measured 40 mm on calcium score 05/27/2020.  Repeat CTA chest in 1 year  Pulmonary nodule: 4 mm right middle lobe nodule noted on calcium score 05/27/2020.  Given smoking history, repeat CT chest for monitoring in 1  year.  RTC in 1 year   Medication Adjustments/Labs and Tests Ordered: Current medicines are reviewed at length with the patient today.  Concerns regarding medicines are outlined above.  Orders Placed This Encounter  Procedures  . CT ANGIO CHEST AORTA W/CM & OR WO/CM   Meds ordered this encounter  Medications  . amLODipine (NORVASC) 10 MG tablet    Sig: Take 1 tablet (10 mg total) by mouth daily.    Dispense:  90 tablet    Refill:  3    Dose change    Patient Instructions  Medication Instructions:  INCREASE amlodipine to 10 mg daily  Please check your blood pressure at home daily, write it down.  Call the office or send message via Mychart with the readings in 2 weeks for Dr. Gardiner Rhyme to review.   *If you need a refill on your cardiac medications before your next appointment, please call your pharmacy*  Testing/Procedures: CTA chest/aorta in 1 year  Follow-Up: At [facility], you and your health needs are our priority.  As part of our continuing mission to provide you with exceptional heart care, we have created designated Provider Care Teams.  These Care Teams include your primary Cardiologist (physician) and Advanced Practice Providers (APPs -  Physician Assistants and Nurse Practitioners) who all work together to provide you with the care you need, when you need it.  We recommend signing up for the patient portal called "MyChart".  Sign up  information is provided on this After Visit Summary.  MyChart is used to connect with patients for Virtual Visits (Telemedicine).  Patients are able to view lab/test results, encounter notes, upcoming appointments, etc.  Non-urgent messages can be sent to your provider as well.   To learn more about what you can do with MyChart, go to [email or web address].    Your next appointment:   12 month(s)  The format for your next appointment:   In Person  Provider:   Oswaldo Milian, MD       Signed, Donato Heinz, MD  07/07/2020 2:42 PM    [place]

## 2020-07-07 ENCOUNTER — Other Ambulatory Visit: Payer: Self-pay

## 2020-07-07 ENCOUNTER — Encounter: Payer: Self-pay | Admitting: Cardiology

## 2020-07-07 ENCOUNTER — Ambulatory Visit (INDEPENDENT_AMBULATORY_CARE_PROVIDER_SITE_OTHER): Payer: Medicare HMO | Admitting: Cardiology

## 2020-07-07 VITALS — BP 160/92 | HR 76 | Ht 69.0 in | Wt 183.0 lb

## 2020-07-07 DIAGNOSIS — I77819 Aortic ectasia, unspecified site: Secondary | ICD-10-CM

## 2020-07-07 DIAGNOSIS — I2510 Atherosclerotic heart disease of native coronary artery without angina pectoris: Secondary | ICD-10-CM | POA: Diagnosis not present

## 2020-07-07 DIAGNOSIS — I10 Essential (primary) hypertension: Secondary | ICD-10-CM

## 2020-07-07 DIAGNOSIS — E785 Hyperlipidemia, unspecified: Secondary | ICD-10-CM | POA: Diagnosis not present

## 2020-07-07 DIAGNOSIS — R911 Solitary pulmonary nodule: Secondary | ICD-10-CM

## 2020-07-07 MED ORDER — AMLODIPINE BESYLATE 10 MG PO TABS: 10.0000 mg | ORAL_TABLET | Freq: Every day | ORAL | 3 refills | Status: DC

## 2020-07-07 NOTE — Patient Instructions (Signed)
Medication Instructions:  INCREASE amlodipine to 10 mg daily  Please check your blood pressure at home daily, write it down.  Call the office or send message via Mychart with the readings in 2 weeks for Dr. Gardiner Rhyme to review.   *If you need a refill on your cardiac medications before your next appointment, please call your pharmacy*  Testing/Procedures: CTA chest/aorta in 1 year  Follow-Up: At [facility], you and your health needs are our priority.  As part of our continuing mission to provide you with exceptional heart care, we have created designated Provider Care Teams.  These Care Teams include your primary Cardiologist (physician) and Advanced Practice Providers (APPs -  Physician Assistants and Nurse Practitioners) who all work together to provide you with the care you need, when you need it.  We recommend signing up for the patient portal called "MyChart".  Sign up information is provided on this After Visit Summary.  MyChart is used to connect with patients for Virtual Visits (Telemedicine).  Patients are able to view lab/test results, encounter notes, upcoming appointments, etc.  Non-urgent messages can be sent to your provider as well.   To learn more about what you can do with MyChart, go to [email or web address].    Your next appointment:   12 month(s)  The format for your next appointment:   In Person  Provider:   Oswaldo Milian, MD

## 2020-08-07 DIAGNOSIS — Z125 Encounter for screening for malignant neoplasm of prostate: Secondary | ICD-10-CM | POA: Diagnosis not present

## 2020-08-07 DIAGNOSIS — I10 Essential (primary) hypertension: Secondary | ICD-10-CM | POA: Diagnosis not present

## 2020-08-07 DIAGNOSIS — E785 Hyperlipidemia, unspecified: Secondary | ICD-10-CM | POA: Diagnosis not present

## 2020-08-07 DIAGNOSIS — Z0000 Encounter for general adult medical examination without abnormal findings: Secondary | ICD-10-CM | POA: Diagnosis not present

## 2021-02-19 DIAGNOSIS — I10 Essential (primary) hypertension: Secondary | ICD-10-CM | POA: Diagnosis not present

## 2021-06-10 ENCOUNTER — Other Ambulatory Visit: Payer: Self-pay | Admitting: Cardiology

## 2021-06-15 ENCOUNTER — Telehealth: Payer: Self-pay

## 2021-06-15 ENCOUNTER — Other Ambulatory Visit: Payer: Self-pay

## 2021-06-15 DIAGNOSIS — I10 Essential (primary) hypertension: Secondary | ICD-10-CM

## 2021-06-15 NOTE — Telephone Encounter (Signed)
Spoke with patient to inform him that a BMET has been ordered for aortic CT. He said he will come this week to have it done.

## 2021-06-15 NOTE — Telephone Encounter (Signed)
Spoke with patient who gave the phone to his son. Son said to have the CT of the aorta scheduled. Message sent to the scheduler.

## 2021-06-17 DIAGNOSIS — Z1389 Encounter for screening for other disorder: Secondary | ICD-10-CM | POA: Diagnosis not present

## 2021-06-17 DIAGNOSIS — Z0000 Encounter for general adult medical examination without abnormal findings: Secondary | ICD-10-CM | POA: Diagnosis not present

## 2021-06-25 DIAGNOSIS — I10 Essential (primary) hypertension: Secondary | ICD-10-CM | POA: Diagnosis not present

## 2021-06-25 DIAGNOSIS — R600 Localized edema: Secondary | ICD-10-CM | POA: Diagnosis not present

## 2021-06-25 DIAGNOSIS — E785 Hyperlipidemia, unspecified: Secondary | ICD-10-CM | POA: Diagnosis not present

## 2021-06-25 DIAGNOSIS — Z125 Encounter for screening for malignant neoplasm of prostate: Secondary | ICD-10-CM | POA: Diagnosis not present

## 2021-06-25 DIAGNOSIS — Z0000 Encounter for general adult medical examination without abnormal findings: Secondary | ICD-10-CM | POA: Diagnosis not present

## 2021-07-01 DIAGNOSIS — I10 Essential (primary) hypertension: Secondary | ICD-10-CM | POA: Diagnosis not present

## 2021-07-01 LAB — BASIC METABOLIC PANEL
BUN/Creatinine Ratio: 12 (ref 10–24)
BUN: 13 mg/dL (ref 8–27)
CO2: 26 mmol/L (ref 20–29)
Calcium: 9.5 mg/dL (ref 8.6–10.2)
Chloride: 102 mmol/L (ref 96–106)
Creatinine, Ser: 1.07 mg/dL (ref 0.76–1.27)
Glucose: 95 mg/dL (ref 70–99)
Potassium: 4.5 mmol/L (ref 3.5–5.2)
Sodium: 139 mmol/L (ref 134–144)
eGFR: 75 mL/min/{1.73_m2} (ref >59)

## 2021-07-07 ENCOUNTER — Other Ambulatory Visit: Payer: Self-pay

## 2021-07-07 ENCOUNTER — Ambulatory Visit (INDEPENDENT_AMBULATORY_CARE_PROVIDER_SITE_OTHER)
Admission: RE | Admit: 2021-07-07 | Discharge: 2021-07-07 | Disposition: A | Discharge status: Home / Self Care | Payer: Medicare HMO | Source: Ambulatory Visit | Attending: Cardiology | Admitting: Cardiology

## 2021-07-07 DIAGNOSIS — I77819 Aortic ectasia, unspecified site: Secondary | ICD-10-CM | POA: Diagnosis not present

## 2021-07-07 DIAGNOSIS — I77810 Thoracic aortic ectasia: Secondary | ICD-10-CM | POA: Diagnosis not present

## 2021-07-07 DIAGNOSIS — I700 Atherosclerosis of aorta: Secondary | ICD-10-CM | POA: Diagnosis not present

## 2021-07-07 DIAGNOSIS — J8410 Pulmonary fibrosis, unspecified: Secondary | ICD-10-CM | POA: Diagnosis not present

## 2021-07-07 DIAGNOSIS — I2510 Atherosclerotic heart disease of native coronary artery without angina pectoris: Secondary | ICD-10-CM | POA: Diagnosis not present

## 2021-07-07 MED ORDER — IOHEXOL 350 MG/ML SOLN
100.0000 mL | Freq: Once | INTRAVENOUS | Status: AC | PRN
  Administered 2021-07-07: 100 mL via INTRAVENOUS

## 2021-07-13 NOTE — Progress Notes (Signed)
Cardiology Office Note:    Date:  07/15/2021   ID:  Jim Rowland, DOB 07/03/1950, MRN [id number]  PCP:  Kathyrn Lass, MD  Cardiologist:  Donato Heinz, MD  Electrophysiologist:  None   Referring MD: Kathyrn Lass, MD   No chief complaint on file.   History of Present Illness:    Jim Rowland is a 71 y.o. male with a hx of hypertension, hyperlipidemia, former tobacco use who presents for follow-up.  He was referred by Dr. Harrington Challenger evaluation of chest pain, initially seen 04/02/2020.  He reports that 1 week prior he was having pain in his upper abdomen/lower chest that he described as burning.  Reports pain has improved since that time.  No clear relationship with exertion.  Reports he walks 6 miles per day.  He denies any shortness of breath, lightheadedness, syncope, or lower extremity edema.  Does not check BP at home.  Reports he smoked for 35 years up to 2 pack/day but quit in 2005.  Family history includes father had MI in 53s and mother had MI in 9s.  Lexiscan Myoview on 04/08/2020 showed normal perfusion, EF 65%.  Calcium score on 05/27/2020 1290 (90th percentile), also noted to have dilated ascending aorta measuring 40 mm and 4 mm right middle lobe pulmonary nodule.  Since last clinic visit, he reports that he has been doing well. Denies any chest pain, dyspnea, lightheadedness, syncope, or palpitations. Reports mild LE edema.  Walks about 3 miles daily, no exertional symptoms.  Past Medical History:  Diagnosis Date   HLD (hyperlipidemia)    Hypertension    Inflammation of lung    years ago in 1979 ; was told that his taking of hot showers the morning nd going out into cold weather caused inflmamtion in his lungs and "left marks " therre. he reports being asymptomatic    Inguinal hernia     Past Surgical History:  Procedure Laterality Date   INGUINAL HERNIA REPAIR Left 1955   INGUINAL HERNIA REPAIR  12/15/2017   Procedure: LAPAROSCOPIC BILATERAL FEMORAL HERNIA, RIGHT  OBTUATOR HERNIA, AND RECURRENT LEFT HERNIA REPAIR, BILATERAL TAP BLOCK;  Surgeon: Michael Boston, MD;  Location: [facility];  Service: General;;   INSERTION OF MESH  12/15/2017   Procedure: INSERTION OF MESH;  Surgeon: Michael Boston, MD;  Location: [facility];  Service: General;;    Current Medications: Current Meds  Medication Sig   aspirin EC 81 MG tablet Take 1 tablet (81 mg total) by mouth daily. Swallow whole.     Allergies:   Patient has no known allergies.   Social History   Socioeconomic History   Marital status: Married    Spouse name: Not on file   Number of children: Not on file   Years of education: Not on file   Highest education level: Not on file  Occupational History   Not on file  Tobacco Use   Smoking status: Former    Types: Cigarettes    Quit date: 12/10/2003    Years since quitting: 17.6   Smokeless tobacco: Never  Vaping Use   Vaping Use: Never used  Substance and Sexual Activity   Alcohol use: Yes    Alcohol/week: 10.0 standard drinks    Types: 10 Cans of beer per week   Drug use: Never   Sexual activity: Not on file  Other Topics Concern   Not on file  Social History Narrative   Originally from [place].      In [place] since  1997   Social Determinants of Radio broadcast assistant Strain: Not on file  Food Insecurity: Not on file  Transportation Needs: Not on file  Physical Activity: Not on file  Stress: Not on file  Social Connections: Not on file     Family History: Family history includes father had MI in 21s and mother had MI in 46s.  ROS:   Please see the history of present illness.     All other systems reviewed and are negative.  EKGs/Labs/Other Studies Reviewed:    The following studies were reviewed today:   EKG:  07/15/21: NSR, rate 63, no ST abnormalities  Recent Labs: 07/01/2021: BUN 13; Creatinine, Ser 1.07; Potassium 4.5; Sodium 139  Recent Lipid Panel    Component Value Date/Time   CHOL 151 04/02/2020 1434   TRIG  203 (H) 04/02/2020 1434   HDL 38 (L) 04/02/2020 1434   CHOLHDL 4.0 04/02/2020 1434   LDLCALC 79 04/02/2020 1434    Physical Exam:    VS:  BP 140/84    Pulse 63    Ht 5\' 9"  (1.753 m)    Wt 189 lb 3.2 oz (85.8 kg)    SpO2 97%    BMI 27.94 kg/m     Wt Readings from Last 3 Encounters:  07/15/21 189 lb 3.2 oz (85.8 kg)  07/07/20 183 lb (83 kg)  04/08/20 183 lb (83 kg)     GEN:  Well nourished, well developed in no acute distress HEENT: Normal NECK: No JVD; No carotid bruits LYMPHATICS: No lymphadenopathy CARDIAC: RRR, no murmurs, rubs, gallops RESPIRATORY:  Clear to auscultation without rales, wheezing or rhonchi  ABDOMEN: Soft, non-tender, non-distended MUSCULOSKELETAL:  1+ edema; No deformity  SKIN: Warm and dry NEUROLOGIC:  Alert and oriented x 3 PSYCHIATRIC:  Normal affect   ASSESSMENT:    1. Coronary artery disease involving native coronary artery of native heart, unspecified whether angina present   2. Lower extremity edema   3. Medication management   4. Essential hypertension   5. Hyperlipidemia, unspecified hyperlipidemia type   6. Aortic dilatation (HCC)     PLAN:    CAD: Reported atypical chest pain.  Lexiscan Myoview on 04/08/2020 showed normal perfusion, EF 65%.  Calcium score on 05/27/2020 1290 (90th percentile). -Continue atorvastatin 20 mg daily  -Start ASA 81 mg daily  LE edema: mild edema on exam today.  Could be due to amlodipine use.  Will check BNP, CMET  Hyperlipidemia: LDL 59 on 06/25/21 on atorvastatin 20 mg daily  Hypertension: On losartan-HCTZ 100-25 mg daily and amlodipine 5 mg daily.  BP elevated in clinic today, will asked to check BP twice daily for next week and call with results  Aortic dilatation: Ascending order measured 40 mm on calcium score 05/27/2020.  CTA chest 07/07/2021 showed ascending aorta measuring 37 mm.  Pulmonary nodule: 4 mm right middle lobe nodule noted on calcium score 05/27/2020.  Stable on repeat CT chest 07/07/2021, also  partially calcified on the study and likely represents granulomatous disease  RTC in 1 year   Medication Adjustments/Labs and Tests Ordered: Current medicines are reviewed at length with the patient today.  Concerns regarding medicines are outlined above.  Orders Placed This Encounter  Procedures   Brain natriuretic peptide   Comprehensive metabolic panel   EKG [id number]   Meds ordered this encounter  Medications   aspirin EC 81 MG tablet    Sig: Take 1 tablet (81 mg total) by mouth daily. Swallow whole.  Dispense:  30 tablet    Refill:  0    Patient Instructions  Medication Instructions:  START: ASPIRIN 81mg  ONCE DAILY- YOU WILL NEED TO GET THIS OVER THE COUNTER  *If you need a refill on your cardiac medications before your next appointment, please call your pharmacy*  LABS TODAY   Follow-Up: At [facility], you and your health needs are our priority.  As part of our continuing mission to provide you with exceptional heart care, we have created designated Provider Care Teams.  These Care Teams include your primary Cardiologist (physician) and Advanced Practice Providers (APPs -  Physician Assistants and Nurse Practitioners) who all work together to provide you with the care you need, when you need it.  Your next appointment:   1 year(s)  The format for your next appointment:   In Person  Provider:   Donato Heinz, MD    Other Instructions  Please check your blood pressure at home twice daily, write it down.  Call the office or send message via Mychart with the readings in 1 week for Dr. Gardiner Rhyme to review.      Signed, Donato Heinz, MD  07/15/2021 8:25 AM    [facility]

## 2021-07-15 ENCOUNTER — Other Ambulatory Visit: Payer: Self-pay

## 2021-07-15 ENCOUNTER — Ambulatory Visit: Payer: Medicare HMO | Admitting: Cardiology

## 2021-07-15 ENCOUNTER — Encounter: Payer: Self-pay | Admitting: Cardiology

## 2021-07-15 VITALS — BP 140/84 | HR 63 | Ht 69.0 in | Wt 189.2 lb

## 2021-07-15 DIAGNOSIS — I10 Essential (primary) hypertension: Secondary | ICD-10-CM

## 2021-07-15 DIAGNOSIS — I77819 Aortic ectasia, unspecified site: Secondary | ICD-10-CM | POA: Diagnosis not present

## 2021-07-15 DIAGNOSIS — E785 Hyperlipidemia, unspecified: Secondary | ICD-10-CM

## 2021-07-15 DIAGNOSIS — I2510 Atherosclerotic heart disease of native coronary artery without angina pectoris: Secondary | ICD-10-CM

## 2021-07-15 DIAGNOSIS — R600 Localized edema: Secondary | ICD-10-CM | POA: Diagnosis not present

## 2021-07-15 DIAGNOSIS — Z79899 Other long term (current) drug therapy: Secondary | ICD-10-CM

## 2021-07-15 LAB — SPECIMEN STATUS REPORT

## 2021-07-15 MED ORDER — ASPIRIN EC 81 MG PO TBEC: 81.0000 mg | DELAYED_RELEASE_TABLET | Freq: Every day | ORAL | 0 refills | Status: AC

## 2021-07-15 NOTE — Patient Instructions (Addendum)
Medication Instructions:  START: ASPIRIN 81mg  ONCE DAILY- YOU WILL NEED TO GET THIS OVER THE COUNTER  *If you need a refill on your cardiac medications before your next appointment, please call your pharmacy*  LABS TODAY   Follow-Up: At [facility], you and your health needs are our priority.  As part of our continuing mission to provide you with exceptional heart care, we have created designated Provider Care Teams.  These Care Teams include your primary Cardiologist (physician) and Advanced Practice Providers (APPs -  Physician Assistants and Nurse Practitioners) who all work together to provide you with the care you need, when you need it.  Your next appointment:   1 year(s)  The format for your next appointment:   In Person  Provider:   Donato Heinz, MD    Other Instructions  Please check your blood pressure at home twice daily, write it down.  Call the office or send message via Mychart with the readings in 1 week for Dr. Gardiner Rhyme to review.

## 2021-07-16 LAB — BRAIN NATRIURETIC PEPTIDE: BNP: 17.7 pg/mL (ref 0.0–100.0)

## 2021-07-16 LAB — COMPREHENSIVE METABOLIC PANEL
ALT: 30 IU/L (ref 0–44)
AST: 28 IU/L (ref 0–40)
Albumin/Globulin Ratio: 2.1 (ref 1.2–2.2)
Albumin: 4.8 g/dL (ref 3.8–4.8)
Alkaline Phosphatase: 92 IU/L (ref 44–121)
BUN/Creatinine Ratio: 12 (ref 10–24)
BUN: 12 mg/dL (ref 8–27)
Bilirubin Total: 0.6 mg/dL (ref 0.0–1.2)
CO2: 24 mmol/L (ref 20–29)
Calcium: 9.7 mg/dL (ref 8.6–10.2)
Chloride: 102 mmol/L (ref 96–106)
Creatinine, Ser: 1.03 mg/dL (ref 0.76–1.27)
Globulin, Total: 2.3 g/dL (ref 1.5–4.5)
Glucose: 88 mg/dL (ref 70–99)
Potassium: 4.8 mmol/L (ref 3.5–5.2)
Sodium: 140 mmol/L (ref 134–144)
Total Protein: 7.1 g/dL (ref 6.0–8.5)
eGFR: 78 mL/min/{1.73_m2} (ref >59)

## 2021-07-22 ENCOUNTER — Encounter: Payer: Self-pay | Admitting: Cardiology

## 2021-07-30 ENCOUNTER — Other Ambulatory Visit: Payer: Self-pay | Admitting: Cardiology

## 2021-07-30 ENCOUNTER — Encounter: Payer: Self-pay | Admitting: Cardiology

## 2021-08-02 NOTE — Telephone Encounter (Signed)
BP intermittently elevated, recommend increasing amlodipine to 10 mg daily.  Please let us know though if swelling in legs worsens with increasing amlodipine. ?

## 2021-08-05 NOTE — Telephone Encounter (Signed)
No changes recommended at this time, would limit dietary salt intake ?

## 2022-02-15 DIAGNOSIS — K219 Gastro-esophageal reflux disease without esophagitis: Secondary | ICD-10-CM | POA: Diagnosis not present

## 2022-03-26 DIAGNOSIS — A048 Other specified bacterial intestinal infections: Secondary | ICD-10-CM | POA: Diagnosis not present

## 2022-03-26 DIAGNOSIS — I10 Essential (primary) hypertension: Secondary | ICD-10-CM | POA: Diagnosis not present

## 2022-04-22 DIAGNOSIS — K5900 Constipation, unspecified: Secondary | ICD-10-CM | POA: Diagnosis not present

## 2022-04-22 DIAGNOSIS — Z6828 Body mass index (BMI) 28.0-28.9, adult: Secondary | ICD-10-CM | POA: Diagnosis not present

## 2022-04-22 DIAGNOSIS — K219 Gastro-esophageal reflux disease without esophagitis: Secondary | ICD-10-CM | POA: Diagnosis not present

## 2022-05-03 DIAGNOSIS — R1013 Epigastric pain: Secondary | ICD-10-CM | POA: Diagnosis not present

## 2022-05-03 DIAGNOSIS — Z6828 Body mass index (BMI) 28.0-28.9, adult: Secondary | ICD-10-CM | POA: Diagnosis not present

## 2022-05-03 DIAGNOSIS — I10 Essential (primary) hypertension: Secondary | ICD-10-CM | POA: Diagnosis not present

## 2022-05-03 DIAGNOSIS — R1084 Generalized abdominal pain: Secondary | ICD-10-CM | POA: Diagnosis not present

## 2022-05-04 ENCOUNTER — Other Ambulatory Visit: Payer: Self-pay | Admitting: Family Medicine

## 2022-05-04 DIAGNOSIS — I10 Essential (primary) hypertension: Secondary | ICD-10-CM | POA: Diagnosis not present

## 2022-05-04 DIAGNOSIS — R1084 Generalized abdominal pain: Secondary | ICD-10-CM

## 2022-05-04 DIAGNOSIS — R1013 Epigastric pain: Secondary | ICD-10-CM | POA: Diagnosis not present

## 2022-05-05 ENCOUNTER — Other Ambulatory Visit: Payer: Self-pay | Admitting: Family Medicine

## 2022-05-05 DIAGNOSIS — R1084 Generalized abdominal pain: Secondary | ICD-10-CM

## 2022-05-07 DIAGNOSIS — Z6828 Body mass index (BMI) 28.0-28.9, adult: Secondary | ICD-10-CM | POA: Diagnosis not present

## 2022-05-07 DIAGNOSIS — R1013 Epigastric pain: Secondary | ICD-10-CM | POA: Diagnosis not present

## 2022-05-13 DIAGNOSIS — K5730 Diverticulosis of large intestine without perforation or abscess without bleeding: Secondary | ICD-10-CM | POA: Diagnosis not present

## 2022-05-13 DIAGNOSIS — K649 Unspecified hemorrhoids: Secondary | ICD-10-CM | POA: Diagnosis not present

## 2022-05-13 DIAGNOSIS — K2970 Gastritis, unspecified, without bleeding: Secondary | ICD-10-CM | POA: Diagnosis not present

## 2022-05-13 DIAGNOSIS — R1013 Epigastric pain: Secondary | ICD-10-CM | POA: Diagnosis not present

## 2022-05-13 DIAGNOSIS — K219 Gastro-esophageal reflux disease without esophagitis: Secondary | ICD-10-CM | POA: Diagnosis not present

## 2022-05-13 DIAGNOSIS — Z86010 Personal history of colonic polyps: Secondary | ICD-10-CM | POA: Diagnosis not present

## 2022-05-13 DIAGNOSIS — Z09 Encounter for follow-up examination after completed treatment for conditions other than malignant neoplasm: Secondary | ICD-10-CM | POA: Diagnosis not present

## 2022-05-13 DIAGNOSIS — K449 Diaphragmatic hernia without obstruction or gangrene: Secondary | ICD-10-CM | POA: Diagnosis not present

## 2022-05-21 ENCOUNTER — Ambulatory Visit
Admission: RE | Admit: 2022-05-21 | Discharge: 2022-05-21 | Disposition: A | Discharge status: Home / Self Care | Payer: Medicare HMO | Source: Ambulatory Visit | Attending: Family Medicine | Admitting: Family Medicine

## 2022-05-21 DIAGNOSIS — R1084 Generalized abdominal pain: Secondary | ICD-10-CM

## 2022-05-21 DIAGNOSIS — M47816 Spondylosis without myelopathy or radiculopathy, lumbar region: Secondary | ICD-10-CM | POA: Diagnosis not present

## 2022-05-21 DIAGNOSIS — K5730 Diverticulosis of large intestine without perforation or abscess without bleeding: Secondary | ICD-10-CM | POA: Diagnosis not present

## 2022-05-21 DIAGNOSIS — N3289 Other specified disorders of bladder: Secondary | ICD-10-CM | POA: Diagnosis not present

## 2022-05-21 DIAGNOSIS — K429 Umbilical hernia without obstruction or gangrene: Secondary | ICD-10-CM | POA: Diagnosis not present

## 2022-05-21 MED ORDER — IOPAMIDOL (ISOVUE-300) INJECTION 61%
100.0000 mL | Freq: Once | INTRAVENOUS | Status: AC | PRN
  Administered 2022-05-21: 100 mL via INTRAVENOUS

## 2022-05-27 ENCOUNTER — Other Ambulatory Visit: Payer: Self-pay | Admitting: Family Medicine

## 2022-05-27 DIAGNOSIS — N289 Disorder of kidney and ureter, unspecified: Secondary | ICD-10-CM | POA: Diagnosis not present

## 2022-05-27 DIAGNOSIS — K2100 Gastro-esophageal reflux disease with esophagitis, without bleeding: Secondary | ICD-10-CM | POA: Diagnosis not present

## 2022-05-27 DIAGNOSIS — Z6828 Body mass index (BMI) 28.0-28.9, adult: Secondary | ICD-10-CM | POA: Diagnosis not present

## 2022-06-03 DIAGNOSIS — K2100 Gastro-esophageal reflux disease with esophagitis, without bleeding: Secondary | ICD-10-CM | POA: Diagnosis not present

## 2022-06-10 ENCOUNTER — Ambulatory Visit
Admission: RE | Admit: 2022-06-10 | Discharge: 2022-06-10 | Disposition: A | Discharge status: Home / Self Care | Payer: Medicare HMO | Source: Ambulatory Visit | Attending: Family Medicine | Admitting: Family Medicine

## 2022-06-10 DIAGNOSIS — N2889 Other specified disorders of kidney and ureter: Secondary | ICD-10-CM | POA: Diagnosis not present

## 2022-06-10 DIAGNOSIS — N289 Disorder of kidney and ureter, unspecified: Secondary | ICD-10-CM

## 2022-06-18 DIAGNOSIS — Z1389 Encounter for screening for other disorder: Secondary | ICD-10-CM | POA: Diagnosis not present

## 2022-06-18 DIAGNOSIS — Z0000 Encounter for general adult medical examination without abnormal findings: Secondary | ICD-10-CM | POA: Diagnosis not present

## 2022-06-23 ENCOUNTER — Other Ambulatory Visit: Payer: Self-pay | Admitting: Family Medicine

## 2022-06-23 DIAGNOSIS — N281 Cyst of kidney, acquired: Secondary | ICD-10-CM

## 2022-06-24 DIAGNOSIS — E785 Hyperlipidemia, unspecified: Secondary | ICD-10-CM | POA: Diagnosis not present

## 2022-06-24 DIAGNOSIS — Z0000 Encounter for general adult medical examination without abnormal findings: Secondary | ICD-10-CM | POA: Diagnosis not present

## 2022-06-24 DIAGNOSIS — R739 Hyperglycemia, unspecified: Secondary | ICD-10-CM | POA: Diagnosis not present

## 2022-06-24 DIAGNOSIS — I700 Atherosclerosis of aorta: Secondary | ICD-10-CM | POA: Diagnosis not present

## 2022-06-24 DIAGNOSIS — Z125 Encounter for screening for malignant neoplasm of prostate: Secondary | ICD-10-CM | POA: Diagnosis not present

## 2022-06-24 DIAGNOSIS — I10 Essential (primary) hypertension: Secondary | ICD-10-CM | POA: Diagnosis not present

## 2022-07-07 ENCOUNTER — Ambulatory Visit
Admission: RE | Admit: 2022-07-07 | Discharge: 2022-07-07 | Disposition: A | Discharge status: Home / Self Care | Payer: Medicare HMO | Source: Ambulatory Visit | Attending: Family Medicine | Admitting: Family Medicine

## 2022-07-07 DIAGNOSIS — N2889 Other specified disorders of kidney and ureter: Secondary | ICD-10-CM | POA: Diagnosis not present

## 2022-07-07 DIAGNOSIS — N281 Cyst of kidney, acquired: Secondary | ICD-10-CM

## 2022-07-07 MED ORDER — GADOPICLENOL 0.5 MMOL/ML IV SOLN
9.0000 mL | Freq: Once | INTRAVENOUS | Status: AC | PRN
  Administered 2022-07-07: 9 mL via INTRAVENOUS

## 2022-08-08 IMAGING — CT CT ANGIO CHEST
3 of 8 series · 18 of 46 positions shown · IV contrast (OMNIPAQUE 350)
Comparison: Coronary calcium score study on 05/27/2020

CLINICAL DATA: Dilated ascending thoracic aorta measuring
approximately 4 cm by prior coronary calcium score study.

EXAM:
CT ANGIOGRAPHY CHEST WITH CONTRAST
TECHNIQUE: Multidetector CT imaging of the chest was performed using the
standard protocol during bolus administration of intravenous
contrast. Multiplanar CT image reconstructions and MIPs were
obtained to evaluate the vascular anatomy.

[Series 4: aorta 3.0 bf37 2 · axial · 0.76mm/px · z∈[-326,-38]mm · 13 of 113 slices shown]
[im 9/113  lung]
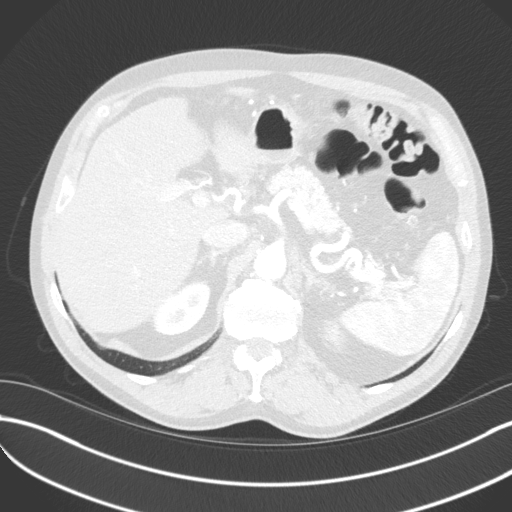
[im 17/113  soft-tissue]
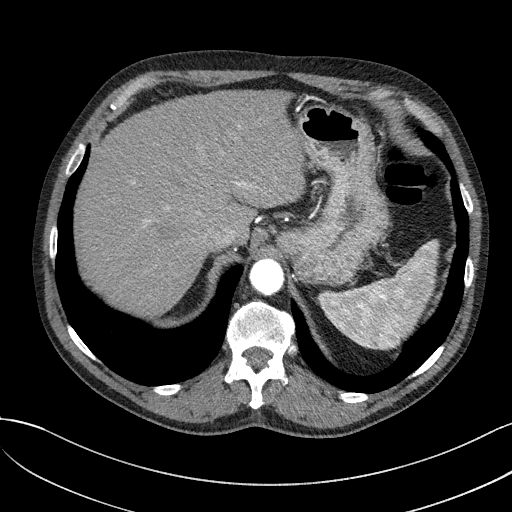
[im 25/113  lung]
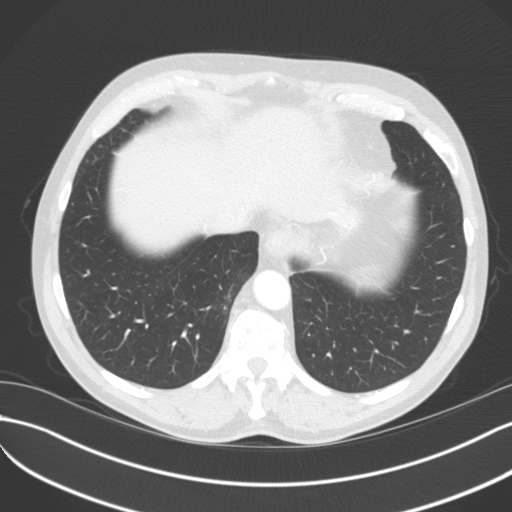
[im 33/113  soft-tissue]
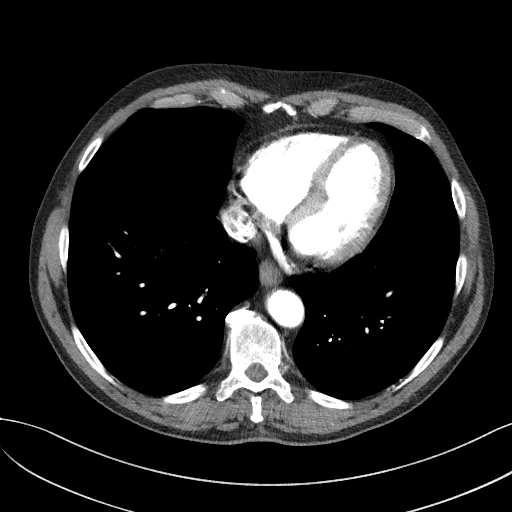
[im 41/113  lung]
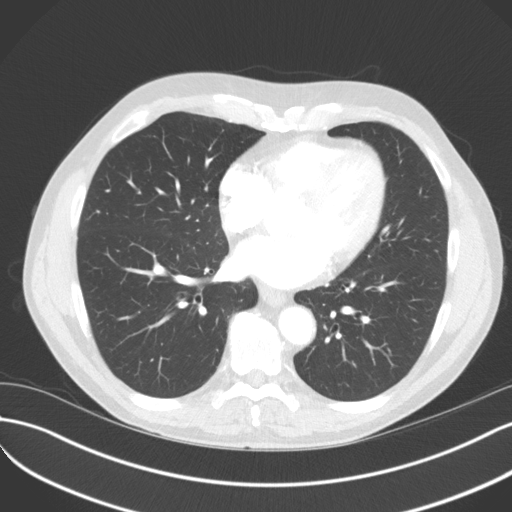
[im 49/113  soft-tissue]
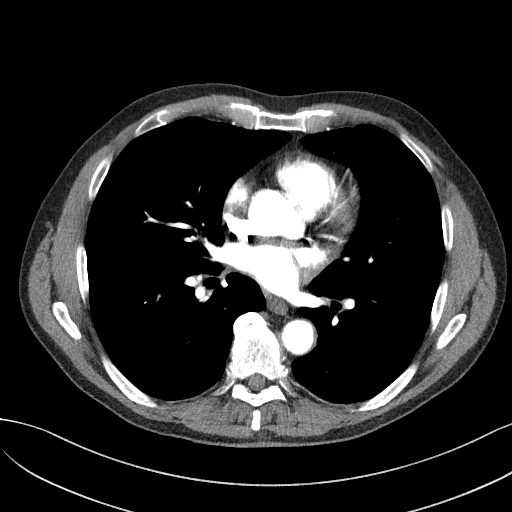
[im 57/113  lung]
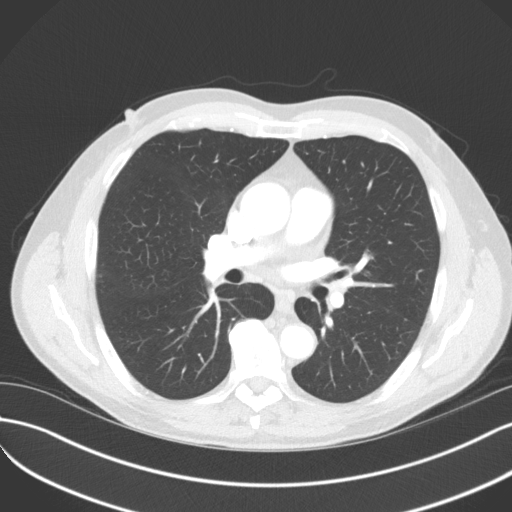
[im 65/113  soft-tissue]
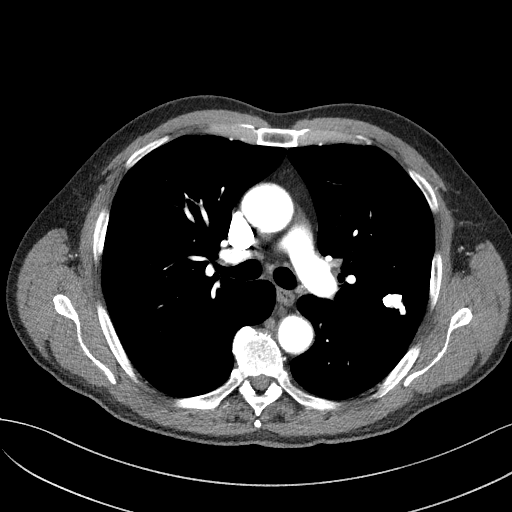
[im 73/113  lung]
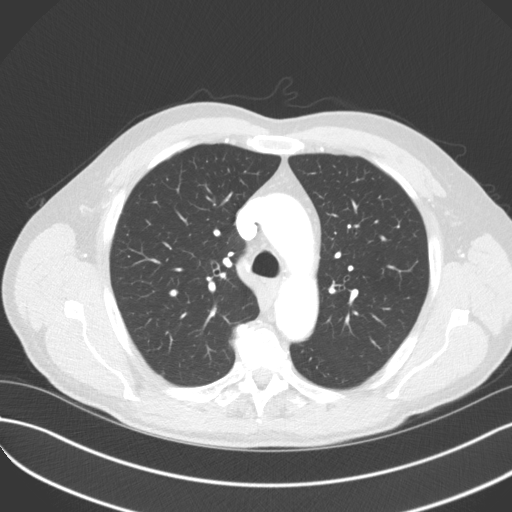
[im 81/113  soft-tissue]
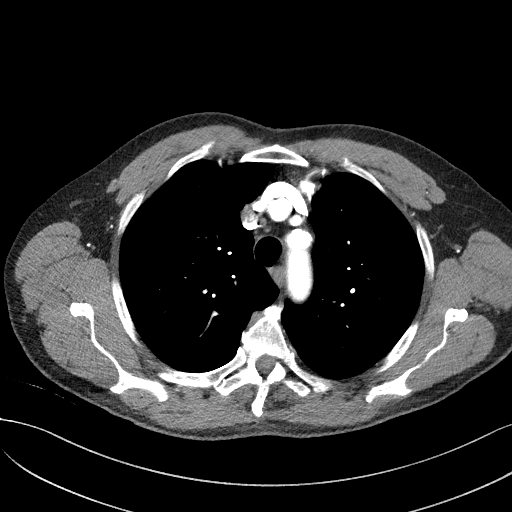
[im 89/113  lung]
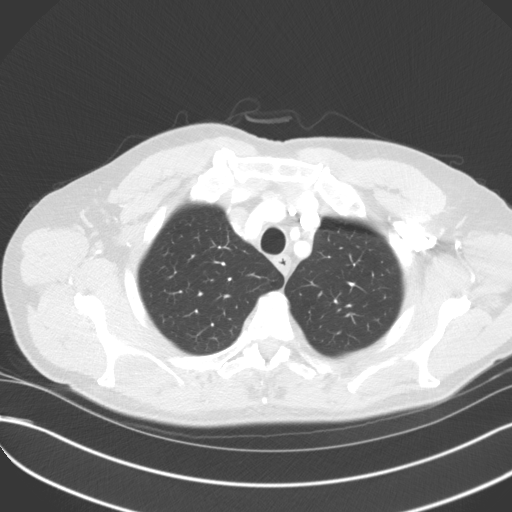
[im 97/113  soft-tissue]
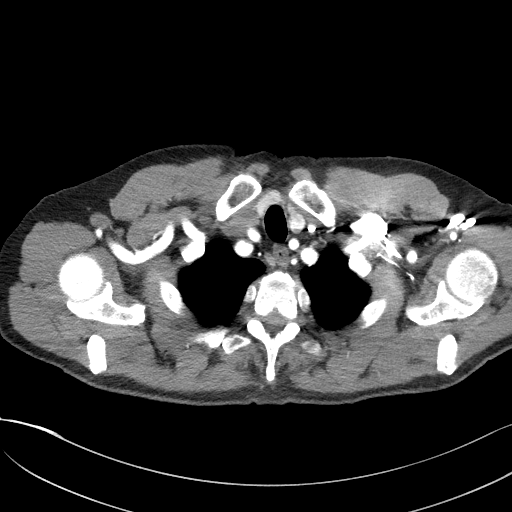
[im 105/113  lung]
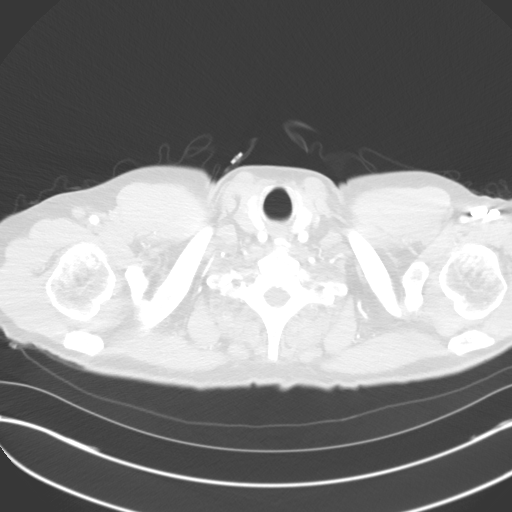

[Series 5: lung · axial · 0.76mm/px · z∈[-326,-278]mm · 2 of 113 slices shown]
[im 9/113  soft-tissue]
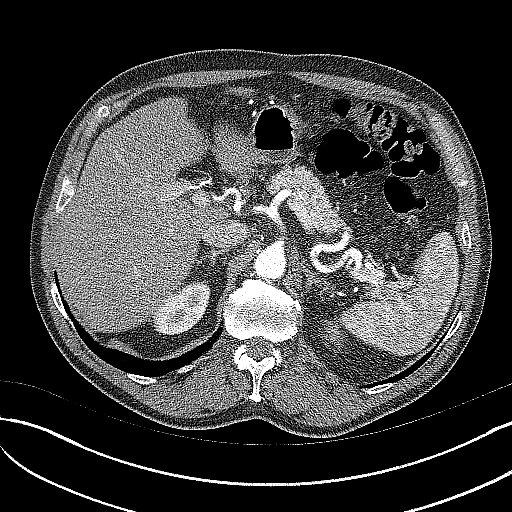
[im 25/113  soft-tissue]
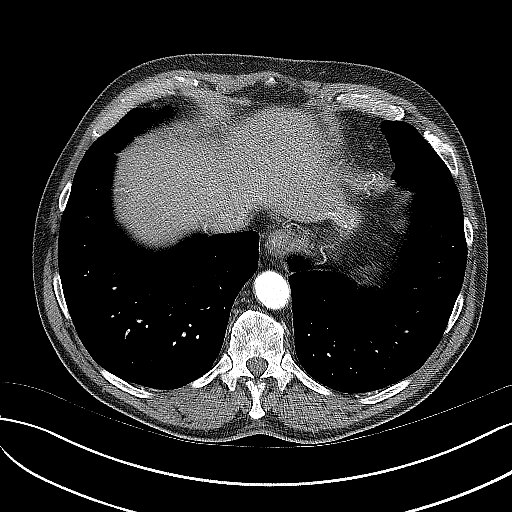

[Series 7: coronals · coronal · 0.69mm/px · 3 of 137 slices shown]
[im 35/137  soft-tissue]
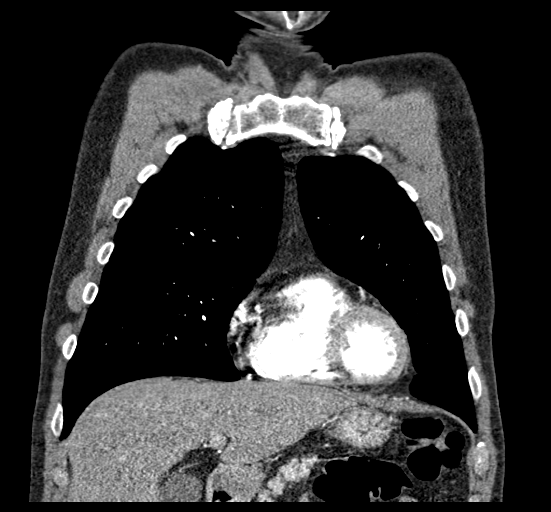
[im 69/137  soft-tissue]
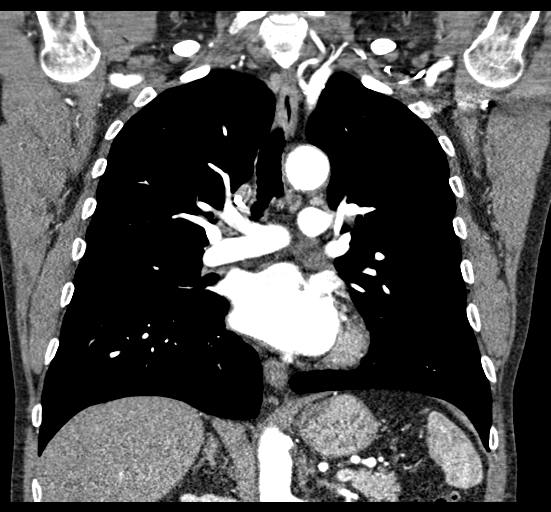
[im 103/137  soft-tissue]
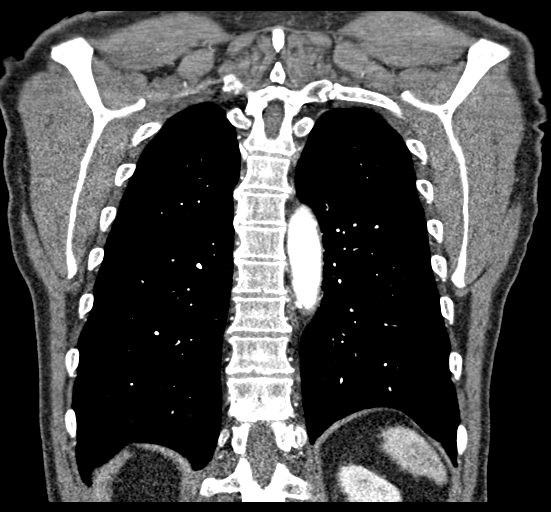

[18 of 46 positions shown; findings below may reference images not displayed]

RADIATION DOSE REDUCTION: This exam was performed according to the
departmental dose-optimization program which includes automated
exposure control, adjustment of the mA and/or kV according to
patient size and/or use of iterative reconstruction technique.

CONTRAST:  100mL OMNIPAQUE IOHEXOL 350 MG/ML SOLN
FINDINGS: Cardiovascular: The aortic root measures 3.8 cm at the level of the
sinuses of Valsalva. By contrast enhanced CTA, the ascending
thoracic aorta is not overtly dilated and measures 3.7 cm in
greatest estimated diameter. The proximal arch measures 3.2 cm and
the distal arch 3.0 cm. The descending thoracic aorta measures
cm. Mild atherosclerosis of the thoracic aorta present without
evidence of dissection. Visualized proximal great vessels
demonstrate mild atherosclerosis without evidence significant
stenosis or aneurysmal disease.

The heart size is within normal limits. Calcified coronary artery
plaque again visualized. No pericardial fluid. Central pulmonary
arteries are normal in caliber.

Mediastinum/Nodes: No enlarged mediastinal, hilar, or axillary lymph
nodes. Thyroid gland, trachea, and esophagus demonstrate no
significant findings.

Lungs/Pleura: Stable 4.5 mm nodule in the right middle lobe which
appears partially calcified on the current study with less motion
and is likely on the basis of prior granulomatous disease. Stable
large calcified granuloma in the left upper lobe. There is no
evidence of pulmonary edema, consolidation, pneumothorax or pleural
fluid.

Upper Abdomen: No acute abnormality.

Musculoskeletal: No chest wall abnormality. No acute or significant
osseous findings.

Review of the MIP images confirms the above findings.
IMPRESSION: 1. No evidence of aneurysmal disease of the thoracic aorta by
contrast enhanced CTA. Maximal caliber of the ascending thoracic
aorta is approximately 3.7 cm.
2. Stable right middle lobe nodule appears partially calcified on
the current study and is likely on the basis of prior granulomatous
disease. Stable left upper lobe granuloma.

## 2022-10-01 ENCOUNTER — Telehealth: Payer: Self-pay | Admitting: Cardiology

## 2022-10-01 ENCOUNTER — Ambulatory Visit: Payer: Medicare HMO | Admitting: Nurse Practitioner

## 2022-10-01 MED ORDER — AMLODIPINE BESYLATE 10 MG PO TABS: 10.0000 mg | ORAL_TABLET | Freq: Every day | ORAL | 0 refills | Status: DC

## 2022-10-01 NOTE — Telephone Encounter (Signed)
*  STAT* If patient is at the pharmacy, call can be transferred to refill team.   1. Which medications need to be refilled? (please list name of each medication and dose if known)   amLODipine (NORVASC) 10 MG tablet   2. Which pharmacy/location (including street and city if local pharmacy) is medication to be sent to?  [id number] - , [place] - [address] AT [facility] [address]   3. Do they need a 30 day or 90 day supply?   90 day  Wife states patient is almost out of this medication an will be going out of town for 3 months.  Patient has appointment scheduled on 8/28.

## 2023-01-12 DIAGNOSIS — I77810 Thoracic aortic ectasia: Secondary | ICD-10-CM | POA: Diagnosis not present

## 2023-01-12 DIAGNOSIS — E785 Hyperlipidemia, unspecified: Secondary | ICD-10-CM | POA: Diagnosis not present

## 2023-01-12 DIAGNOSIS — I10 Essential (primary) hypertension: Secondary | ICD-10-CM | POA: Diagnosis not present

## 2023-01-19 ENCOUNTER — Encounter: Payer: Self-pay | Admitting: Nurse Practitioner

## 2023-01-19 ENCOUNTER — Ambulatory Visit: Payer: Medicare HMO | Admitting: Nurse Practitioner

## 2023-01-19 VITALS — BP 138/88 | HR 68 | Ht 69.0 in | Wt 184.2 lb

## 2023-01-19 DIAGNOSIS — I2510 Atherosclerotic heart disease of native coronary artery without angina pectoris: Secondary | ICD-10-CM

## 2023-01-19 DIAGNOSIS — R600 Localized edema: Secondary | ICD-10-CM

## 2023-01-19 DIAGNOSIS — I77819 Aortic ectasia, unspecified site: Secondary | ICD-10-CM

## 2023-01-19 DIAGNOSIS — I10 Essential (primary) hypertension: Secondary | ICD-10-CM

## 2023-01-19 DIAGNOSIS — E785 Hyperlipidemia, unspecified: Secondary | ICD-10-CM

## 2023-01-19 NOTE — Patient Instructions (Addendum)
Medication Instructions:  Your physician recommends that you continue on your current medications as directed. Please refer to the Current Medication list given to you today.  *If you need a refill on your cardiac medications before your next appointment, please call your pharmacy*   Lab Work: None   Testing/Procedures: NONE ordered at this time of appointment     Follow-Up: At [facility], you and your health needs are our priority.  As part of our continuing mission to provide you with exceptional heart care, we have created designated Provider Care Teams.  These Care Teams include your primary Cardiologist (physician) and Advanced Practice Providers (APPs -  Physician Assistants and Nurse Practitioners) who all work together to provide you with the care you need, when you need it.  We recommend signing up for the patient portal called "MyChart".  Sign up information is provided on this After Visit Summary.  MyChart is used to connect with patients for Virtual Visits (Telemedicine).  Patients are able to view lab/test results, encounter notes, upcoming appointments, etc.  Non-urgent messages can be sent to your provider as well.   To learn more about what you can do with MyChart, go to [email or web address].    Your next appointment:   1 year(s)  Provider:   Little Ishikawa, MD

## 2023-01-19 NOTE — Progress Notes (Signed)
Office Visit    Patient Name: Sophat Dubow Date of Encounter: 01/19/2023  Primary Care Provider:  Sigmund Hazel, MD Primary Cardiologist:  Little Ishikawa, MD  Chief Complaint    72 year old male with a history of CAD, ascending aortic dilation, bilateral lower extremity edema, hypertension, and hyperlipidemia who presents for follow-up related to CAD.  Past Medical History    Past Medical History:  Diagnosis Date   HLD (hyperlipidemia)    Hypertension    Inflammation of lung    years ago in 1979 ; was told that his taking of hot showers the morning nd going out into cold weather caused inflmamtion in his lungs and "left marks " therre. he reports being asymptomatic    Inguinal hernia    Past Surgical History:  Procedure Laterality Date   INGUINAL HERNIA REPAIR Left 1955   INGUINAL HERNIA REPAIR  12/15/2017   Procedure: LAPAROSCOPIC BILATERAL FEMORAL HERNIA, RIGHT OBTUATOR HERNIA, AND RECURRENT LEFT HERNIA REPAIR, BILATERAL TAP BLOCK;  Surgeon: Karie Soda, MD;  Location: [facility];  Service: General;;   INSERTION OF MESH  12/15/2017   Procedure: INSERTION OF MESH;  Surgeon: Karie Soda, MD;  Location: [facility];  Service: General;;    Allergies  No Known Allergies   Labs/Other Studies Reviewed    The following studies were reviewed today:  Cardiac Studies & Procedures     STRESS TESTS  MYOCARDIAL PERFUSION IMAGING 04/08/2020  Narrative  Nuclear stress EF: 65%.  The left ventricular ejection fraction is normal (55-65%).  Blood pressure demonstrated a normal response to exercise.  There was no ST segment deviation noted during stress.  Defect 1: There is a medium defect of mild severity present in the basal inferoseptal and mid inferoseptal location.  The study is normal.  This is a low risk study.  Normal stress nuclear study with mild thinning of the inferoseptal wall but no ischemia.  Gated ejection fraction 65% with normal wall motion.      CT  SCANS  CT CARDIAC SCORING (SELF PAY ONLY) 05/27/2020  Addendum 05/28/2020 10:29 PM ADDENDUM REPORT: 05/28/2020 22:26  CLINICAL DATA:  Risk stratification  EXAM: Coronary Calcium Score  TECHNIQUE: The patient was scanned on a [employer] scanner. Axial non-contrast 3 mm slices were carried out through the heart. The data set was analyzed on a dedicated work station and scored using the Agatson method.  FINDINGS: Non-cardiac: See separate report from [facility].  Ascending Aorta: Dilated measuring 40mm  Pericardium: Normal  Coronary arteries: Calcium score 1290  IMPRESSION: 1. Coronary calcium score of 1290. This was 90th percentile for age and sex matched control.  2. Dilated ascending aorta measuring 40mm   Electronically Signed By: Epifanio Lesches MD On: 05/28/2020 22:26  Narrative EXAM: OVER-READ INTERPRETATION  CT CHEST  The following report is an over-read performed by radiologist Dr. Trudie Reed of [facility] on 05/27/2020. This over-read does not include interpretation of cardiac or coronary anatomy or pathology. The coronary calcium score interpretation by the cardiologist is attached.  COMPARISON:  None.  FINDINGS: Atherosclerotic calcifications in the thoracic aorta. 4 mm right middle lobe pulmonary nodule (axial image 14 of series 3). Large calcified granuloma in the posterior aspect of the left upper lobe incompletely imaged but measuring at least 2.2 x 1.7 cm. Within the visualized portions of the thorax there are no other suspicious appearing pulmonary nodules or masses, there is no acute consolidative airspace disease, no pleural effusions, no pneumothorax and no  lymphadenopathy. Visualized portions of the upper abdomen are unremarkable. There are no aggressive appearing lytic or blastic lesions noted in the visualized portions of the skeleton.  IMPRESSION: 1. 4 mm right middle lobe pulmonary nodule,  nonspecific, but statistically likely benign. No follow-up needed if patient is low-risk. Non-contrast chest CT can be considered in 12 months if patient is high-risk. This recommendation follows the consensus statement: Guidelines for Management of Incidental Pulmonary Nodules Detected on CT Images: From the Fleischner Society 2017; Radiology 2017; 284:228-243. 2. Large calcified granuloma in the left upper lobe incidentally noted. 3.  Aortic Atherosclerosis (ICD10-I70.0).  Electronically Signed: By: Trudie Reed M.D. On: 05/27/2020 08:35         Recent Labs: No results found for requested labs within last 365 days.  Recent Lipid Panel    Component Value Date/Time   CHOL 151 04/02/2020 1434   TRIG 203 (H) 04/02/2020 1434   HDL 38 (L) 04/02/2020 1434   CHOLHDL 4.0 04/02/2020 1434   LDLCALC 79 04/02/2020 1434    History of Present Illness    72 year old male with the above past medical history including CAD, ascending aortic dilation, bilateral lower extremity edema, hypertension, and hyperlipidemia.  He was initially referred to cardiology in the setting of chest pain.  Lexiscan Myoview in 03/2020 showed normal perfusion, EF 65%.  Coronary calcium score in 05/2020 was 1290 (90th percentile).  He was noted to have mild dilation of the ascending aorta measuring 40 mm as well as 4 mm right middle lobe pulmonary nodule.  CT of the chest aorta in 06/2021 showed ascending aorta measuring 37 mm, stable 4.5 mm right middle lobe nodule, which appeared partially calcified, likely related to prior granulomatous disease.  He was last seen in the office on 07/15/2021 and was doing well from a cardiac standpoint.  He denied symptoms concerning for angina.   He presents today for follow-up.  Since his last visit he has done well from a cardiac standpoint.  He denies any symptoms concerning for angina.  Denies any dyspnea, edema, PND, orthopnea, weight gain.  BP has been well-controlled.  Overall,  he reports feeling well.  Home Medications    Current Outpatient Medications  Medication Sig Dispense Refill   amLODipine (NORVASC) 10 MG tablet Take 1 tablet (10 mg total) by mouth daily. 90 tablet 0   aspirin EC 81 MG tablet Take 1 tablet (81 mg total) by mouth daily. Swallow whole. 30 tablet 0   atorvastatin (LIPITOR) 20 MG tablet TAKE 1 TABLET(20 MG) BY MOUTH DAILY 90 tablet 3   ibuprofen (ADVIL,MOTRIN) 200 MG tablet Take 400 mg by mouth every 6 (six) hours as needed for mild pain or moderate pain.     losartan-hydrochlorothiazide (HYZAAR) 100-25 MG tablet Take 1 tablet by mouth daily.     pantoprazole (PROTONIX) 20 MG tablet Take 20 mg by mouth daily.     traMADol (ULTRAM) 50 MG tablet Take 1-2 tablets (50-100 mg total) by mouth every 6 (six) hours as needed for moderate pain or severe pain. 30 tablet 0   No current facility-administered medications for this visit.     Review of Systems    He denies chest pain, palpitations, dyspnea, pnd, orthopnea, n, v, dizziness, syncope, edema, weight gain, or early satiety. All other systems reviewed and are otherwise negative except as noted above.   Physical Exam    VS:  BP 138/88   Pulse 68   Ht 5\' 9"  (1.753 m)   Wt  184 lb 3.2 oz (83.6 kg)   SpO2 97%   BMI 27.20 kg/m   GEN: Well nourished, well developed, in no acute distress. HEENT: normal. Neck: Supple, no JVD, carotid bruits, or masses. Cardiac: RRR, no murmurs, rubs, or gallops. No clubbing, cyanosis, edema.  Radials/DP/PT 2+ and equal bilaterally.  Respiratory:  Respirations regular and unlabored, clear to auscultation bilaterally. GI: Soft, nontender, nondistended, BS + x 4. MS: no deformity or atrophy. Skin: warm and dry, no rash. Neuro:  Strength and sensation are intact. Psych: Normal affect.  Accessory Clinical Findings    ECG personally reviewed by me today -    NSR, 68 bpm-no acute changes.   Lab Results  Component Value Date   WBC 9.4 12/09/2017   HGB 14.8  12/09/2017   HCT 43.1 12/09/2017   MCV 83.7 12/09/2017   PLT 241 12/09/2017   Lab Results  Component Value Date   CREATININE 1.03 07/15/2021   BUN 12 07/15/2021   NA 140 07/15/2021   K 4.8 07/15/2021   CL 102 07/15/2021   CO2 24 07/15/2021   Lab Results  Component Value Date   ALT 30 07/15/2021   AST 28 07/15/2021   ALKPHOS 92 07/15/2021   BILITOT 0.6 07/15/2021   Lab Results  Component Value Date   CHOL 151 04/02/2020   HDL 38 (L) 04/02/2020   LDLCALC 79 04/02/2020   TRIG 203 (H) 04/02/2020   CHOLHDL 4.0 04/02/2020    No results found for: "HGBA1C"  Assessment & Plan    1. CAD: Coronary calcium score in 05/2020 was 1290 (90th percentile). Stable with no anginal symptoms. No indication for ischemic evaluation.  Continue aspirin, amlodipine, losartan-HCTZ, Lipitor.  2. Dilation of ascending aorta: Coronary CT in 05/2020 revealed dilation of ascending aorta measuring 40 mm.  CT chest/aorta in 06/2021 showed ascending aorta measuring 37 mm, stable right middle lobe lung nodule.  Patient declines repeat CT at this time. Can consider repeat CT chest/aorta at next follow-up visit.  BP well-controlled.  3. Bilateral lower extremity edema: Denies any recent edema. Euvolemic and well compensated on exam.  4. Hypertension: BP well controlled. Continue current antihypertensive regimen.   5. Hyperlipidemia: LDL was 64 in 06/2022. Continue Lipitor.  6. Disposition: Follow-up in 1 year, sooner if needed.     Joylene Grapes, NP 01/19/2023, 11:07 AM

## 2023-03-31 ENCOUNTER — Telehealth: Payer: Self-pay | Admitting: Cardiology

## 2023-03-31 MED ORDER — AMLODIPINE BESYLATE 10 MG PO TABS: 10.0000 mg | ORAL_TABLET | Freq: Every day | ORAL | 2 refills | Status: AC

## 2023-03-31 NOTE — Telephone Encounter (Signed)
Pt's medication was sent to pt's pharmacy as requested. Confirmation received.  °

## 2023-03-31 NOTE — Telephone Encounter (Signed)
*  STAT* If patient is at the pharmacy, call can be transferred to refill team.   1. Which medications need to be refilled? (please list name of each medication and dose if known) amLODipine (NORVASC) 10 MG tablet  2. Which pharmacy/location (including street and city if local pharmacy) is medication to be sent to [place], [address] AT [place]  3. Do they need a 30 day or 90 day supply? [place]

## 2023-04-15 DIAGNOSIS — H6123 Impacted cerumen, bilateral: Secondary | ICD-10-CM | POA: Diagnosis not present

## 2023-04-15 DIAGNOSIS — L299 Pruritus, unspecified: Secondary | ICD-10-CM | POA: Diagnosis not present

## 2023-07-22 DIAGNOSIS — I10 Essential (primary) hypertension: Secondary | ICD-10-CM | POA: Diagnosis not present

## 2023-07-22 DIAGNOSIS — E785 Hyperlipidemia, unspecified: Secondary | ICD-10-CM | POA: Diagnosis not present

## 2023-07-22 DIAGNOSIS — I700 Atherosclerosis of aorta: Secondary | ICD-10-CM | POA: Diagnosis not present

## 2023-07-22 DIAGNOSIS — K2100 Gastro-esophageal reflux disease with esophagitis, without bleeding: Secondary | ICD-10-CM | POA: Diagnosis not present

## 2023-07-22 DIAGNOSIS — I77810 Thoracic aortic ectasia: Secondary | ICD-10-CM | POA: Diagnosis not present

## 2023-07-22 DIAGNOSIS — Z0000 Encounter for general adult medical examination without abnormal findings: Secondary | ICD-10-CM | POA: Diagnosis not present

## 2023-07-22 DIAGNOSIS — Z125 Encounter for screening for malignant neoplasm of prostate: Secondary | ICD-10-CM | POA: Diagnosis not present

## 2023-08-23 DIAGNOSIS — G4700 Insomnia, unspecified: Secondary | ICD-10-CM | POA: Diagnosis not present

## 2024-01-17 NOTE — Progress Notes (Unsigned)
 Cardiology Office Note:    Date:  01/19/2024   ID:  Jim Rowland, DOB 1951-02-25, MRN [id number]  PCP:  Cleotilde Planas, MD  Cardiologist:  Lonni LITTIE Nanas, MD  Electrophysiologist:  None   Referring MD: Cleotilde Planas, MD   Chief Complaint  Patient presents with   Coronary Artery Disease    History of Present Illness:    Jim Rowland is a 73 y.o. male with a hx of hypertension, hyperlipidemia, former tobacco use who presents for follow-up.  He was referred by Dr. Okey evaluation of chest pain, initially seen 04/02/2020.  He reports that 1 week prior he was having pain in his upper abdomen/lower chest that he described as burning.  Reports pain has improved since that time.  No clear relationship with exertion.  Reports he walks 6 miles per day.  He denies any shortness of breath, lightheadedness, syncope, or lower extremity edema.  Does not check BP at home.  Reports he smoked for 35 years up to 2 pack/day but quit in 2005.  Family history includes father had MI in 12s and mother had MI in 32s.  Lexiscan Myoview  on 04/08/2020 showed normal perfusion, EF 65%.  Calcium  score on 05/27/2020 1290 (90th percentile), also noted to have dilated ascending aorta measuring 40 mm and 4 mm right middle lobe pulmonary nodule.  Since last clinic visit, he reports he is doing well.  Denies any chest pain, dyspnea, lightheadedness, syncope, lower extremity edema, or palpitations.  Walking daily for 3 miles.  Denies any exertional symptoms.  Past Medical History:  Diagnosis Date   HLD (hyperlipidemia)    Hypertension    Inflammation of lung    years ago in 1979 ; was told that his taking of hot showers the morning nd going out into cold weather caused inflmamtion in his lungs and left marks  therre. he reports being asymptomatic    Inguinal hernia     Past Surgical History:  Procedure Laterality Date   INGUINAL HERNIA REPAIR Left 1955   INGUINAL HERNIA REPAIR  12/15/2017   Procedure: LAPAROSCOPIC  BILATERAL FEMORAL HERNIA, RIGHT OBTUATOR HERNIA, AND RECURRENT LEFT HERNIA REPAIR, BILATERAL TAP BLOCK;  Surgeon: Sheldon Standing, MD;  Location: [facility];  Service: General;;   INSERTION OF MESH  12/15/2017   Procedure: INSERTION OF MESH;  Surgeon: Sheldon Standing, MD;  Location: [facility];  Service: General;;    Current Medications: No outpatient medications have been marked as taking for the 01/19/24 encounter (Office Visit) with Nanas Lonni LITTIE, MD.     Allergies:   Patient has no known allergies.   Social History   Socioeconomic History   Marital status: Married    Spouse name: Not on file   Number of children: Not on file   Years of education: Not on file   Highest education level: Not on file  Occupational History   Not on file  Tobacco Use   Smoking status: Former    Current packs/day: 0.00    Types: Cigarettes    Quit date: 12/10/2003    Years since quitting: 20.1   Smokeless tobacco: Never  Vaping Use   Vaping status: Never Used  Substance and Sexual Activity   Alcohol use: Yes    Alcohol/week: 10.0 standard drinks of alcohol    Types: 10 Cans of beer per week   Drug use: Never   Sexual activity: Not on file  Other Topics Concern   Not on file  Social History Narrative  Originally from [place].      In [place] since 1997   Social Drivers of Health   Financial Resource Strain: Not on [employer] Insecurity: Not on file  Transportation Needs: Not on file  Physical Activity: Not on file  Stress: Not on file  Social Connections: Not on file     Family History: Family history includes father had MI in 33s and mother had MI in 54s.  ROS:   Please see the history of present illness.     All other systems reviewed and are negative.  EKGs/Labs/Other Studies Reviewed:    The following studies were reviewed today:   EKG:  07/15/21: NSR, rate 63, no ST abnormalities 01/19/2024: Normal sinus rhythm, rate 73, no ST abnormalities  Recent Labs: No results  found for requested labs within last 365 days.  Recent Lipid Panel    Component Value Date/Time   CHOL 151 04/02/2020 1434   TRIG 203 (H) 04/02/2020 1434   HDL 38 (L) 04/02/2020 1434   CHOLHDL 4.0 04/02/2020 1434   LDLCALC 79 04/02/2020 1434    Physical Exam:    VS:  BP (!) 142/88 (BP Location: Left Arm)   Pulse 84   Ht 5' 7 (1.702 m)   Wt 177 lb (80.3 kg)   SpO2 97%   BMI 27.72 kg/m     Wt Readings from Last 3 Encounters:  01/19/24 177 lb (80.3 kg)  01/19/23 184 lb 3.2 oz (83.6 kg)  07/15/21 189 lb 3.2 oz (85.8 kg)     GEN:  Well nourished, well developed in no acute distress HEENT: Normal NECK: No JVD; No carotid bruits LYMPHATICS: No lymphadenopathy CARDIAC: RRR, no murmurs, rubs, gallops RESPIRATORY:  Clear to auscultation without rales, wheezing or rhonchi  ABDOMEN: Soft, non-tender, non-distended MUSCULOSKELETAL: trivial edema; No deformity  SKIN: Warm and dry NEUROLOGIC:  Alert and oriented x 3 PSYCHIATRIC:  Normal affect   ASSESSMENT:    1. Coronary artery disease involving native coronary artery of native heart without angina pectoris   2. Essential hypertension   3. Hyperlipidemia, unspecified hyperlipidemia type   4. Aortic dilatation (HCC)   5. Medication management      PLAN:    CAD: Reported atypical chest pain.  Lexiscan Myoview  on 04/08/2020 showed normal perfusion, EF 65%.  Calcium  score on 05/27/2020 1290 (90th percentile). -Continue atorvastatin  20 mg daily  -Continue ASA 81 mg daily  LE edema: mild edema.  Suspect due to amlodipine  use.  Normal BNP, albumin 06/2021.   -Has improved, only trace on exam today  Hyperlipidemia: LDL 59 on 06/25/21 on atorvastatin  20 mg daily.  Update lipid panel  Hypertension: On losartan-HCTZ 100-25 mg daily and amlodipine  10 mg daily.  BP elevated in clinic today, asked to check BP twice daily for next week and let us  know results  Aortic dilatation: Ascending order measured 40 mm on calcium  score 05/27/2020.   CTA chest 07/07/2021 showed ascending aorta measuring 37 mm.  Pulmonary nodule: 4 mm right middle lobe nodule noted on calcium  score 05/27/2020.  Stable on repeat CT chest 07/07/2021, also partially calcified on the study and likely represents granulomatous disease  RTC in 1 year   Medication Adjustments/Labs and Tests Ordered: Current medicines are reviewed at length with the patient today.  Concerns regarding medicines are outlined above.  Orders Placed This Encounter  Procedures   Basic Metabolic Panel (BMET)   Lipid Profile   Magnesium   EKG 12-Lead   No orders of  the defined types were placed in this encounter.   Patient Instructions  Medication Instructions:  Your physician recommends that you continue on your current medications as directed. Please refer to the Current Medication list given to you today.  *If you need a refill on your cardiac medications before your next appointment, please call your pharmacy*  Lab Work: Today: Lipid panel, BMET & Magnesium level  If you have any lab test that is abnormal or we need to change your treatment, we will call you to review the results.  Testing/Procedures: None ordered  Follow-Up: At [facility], you and your health needs are our priority.  As part of our continuing mission to provide you with exceptional heart care, our providers are all part of one team.  This team includes your primary Cardiologist (physician) and Advanced Practice Providers or APPs (Physician Assistants and Nurse Practitioners) who all work together to provide you with the care you need, when you need it.  Your next appointment:   1 year  Provider:   Lonni LITTIE Nanas, MD     Thank you for choosing [facility]!!    [phone]   Other Instructions  Check you blood pressure twice daily for the next week and send in your results via mychart.      Signed, Lonni LITTIE Nanas, MD  01/19/2024 6:12 PM    [facility]

## 2024-01-19 ENCOUNTER — Other Ambulatory Visit: Payer: Self-pay | Admitting: *Deleted

## 2024-01-19 ENCOUNTER — Ambulatory Visit: Attending: Cardiology | Admitting: Cardiology

## 2024-01-19 VITALS — BP 142/88 | HR 84 | Ht 67.0 in | Wt 177.0 lb

## 2024-01-19 DIAGNOSIS — I10 Essential (primary) hypertension: Secondary | ICD-10-CM | POA: Diagnosis not present

## 2024-01-19 DIAGNOSIS — I2510 Atherosclerotic heart disease of native coronary artery without angina pectoris: Secondary | ICD-10-CM

## 2024-01-19 DIAGNOSIS — Z79899 Other long term (current) drug therapy: Secondary | ICD-10-CM | POA: Diagnosis not present

## 2024-01-19 DIAGNOSIS — I77819 Aortic ectasia, unspecified site: Secondary | ICD-10-CM | POA: Diagnosis not present

## 2024-01-19 DIAGNOSIS — E785 Hyperlipidemia, unspecified: Secondary | ICD-10-CM

## 2024-01-19 NOTE — Patient Instructions (Addendum)
 Medication Instructions:  Your physician recommends that you continue on your current medications as directed. Please refer to the Current Medication list given to you today.  *If you need a refill on your cardiac medications before your next appointment, please call your pharmacy*  Lab Work: Today: Lipid panel, BMET & Magnesium level  If you have any lab test that is abnormal or we need to change your treatment, we will call you to review the results.  Testing/Procedures: None ordered  Follow-Up: At [facility], you and your health needs are our priority.  As part of our continuing mission to provide you with exceptional heart care, our providers are all part of one team.  This team includes your primary Cardiologist (physician) and Advanced Practice Providers or APPs (Physician Assistants and Nurse Practitioners) who all work together to provide you with the care you need, when you need it.  Your next appointment:   1 year  Provider:   Lonni LITTIE Nanas, MD     Thank you for choosing [facility]!!    [phone]   Other Instructions  Check you blood pressure twice daily for the next week and send in your results via mychart.

## 2024-01-20 ENCOUNTER — Other Ambulatory Visit: Payer: Self-pay

## 2024-01-20 ENCOUNTER — Ambulatory Visit: Payer: Self-pay | Admitting: Cardiology

## 2024-01-20 DIAGNOSIS — E785 Hyperlipidemia, unspecified: Secondary | ICD-10-CM

## 2024-01-20 LAB — LIPID PANEL
Chol/HDL Ratio: 4.8 ratio (ref 0.0–5.0)
Cholesterol, Total: 186 mg/dL (ref 100–199)
HDL: 39 mg/dL — ABNORMAL LOW (ref >39)
LDL Chol Calc (NIH): 113 mg/dL — ABNORMAL HIGH (ref 0–99)
Triglycerides: 192 mg/dL — ABNORMAL HIGH (ref 0–149)
VLDL Cholesterol Cal: 34 mg/dL (ref 5–40)

## 2024-01-20 LAB — BASIC METABOLIC PANEL WITH GFR
BUN/Creatinine Ratio: 15 (ref 10–24)
BUN: 14 mg/dL (ref 8–27)
CO2: 21 mmol/L (ref 20–29)
Calcium: 10 mg/dL (ref 8.6–10.2)
Chloride: 101 mmol/L (ref 96–106)
Creatinine, Ser: 0.94 mg/dL (ref 0.76–1.27)
Glucose: 84 mg/dL (ref 70–99)
Potassium: 4.2 mmol/L (ref 3.5–5.2)
Sodium: 139 mmol/L (ref 134–144)
eGFR: 86 mL/min/1.73 (ref >59)

## 2024-01-20 LAB — MAGNESIUM: Magnesium: 2.1 mg/dL (ref 1.6–2.3)

## 2024-01-20 MED ORDER — ATORVASTATIN CALCIUM 40 MG PO TABS: 40.0000 mg | ORAL_TABLET | Freq: Every day | ORAL | 3 refills | Status: AC

## 2024-01-20 NOTE — Telephone Encounter (Signed)
 error

## 2024-02-03 DIAGNOSIS — I700 Atherosclerosis of aorta: Secondary | ICD-10-CM | POA: Diagnosis not present

## 2024-02-03 DIAGNOSIS — Z6828 Body mass index (BMI) 28.0-28.9, adult: Secondary | ICD-10-CM | POA: Diagnosis not present

## 2024-02-03 DIAGNOSIS — K2100 Gastro-esophageal reflux disease with esophagitis, without bleeding: Secondary | ICD-10-CM | POA: Diagnosis not present

## 2024-02-03 DIAGNOSIS — I77810 Thoracic aortic ectasia: Secondary | ICD-10-CM | POA: Diagnosis not present

## 2024-02-03 DIAGNOSIS — G4700 Insomnia, unspecified: Secondary | ICD-10-CM | POA: Diagnosis not present

## 2024-02-03 DIAGNOSIS — I10 Essential (primary) hypertension: Secondary | ICD-10-CM | POA: Diagnosis not present

## 2024-02-03 DIAGNOSIS — E785 Hyperlipidemia, unspecified: Secondary | ICD-10-CM | POA: Diagnosis not present

## 2024-02-04 ENCOUNTER — Encounter: Payer: Self-pay | Admitting: Cardiology

## 2024-04-23 DIAGNOSIS — E785 Hyperlipidemia, unspecified: Secondary | ICD-10-CM | POA: Diagnosis not present

## 2024-04-24 ENCOUNTER — Ambulatory Visit: Payer: Self-pay | Admitting: Cardiology

## 2024-04-24 LAB — LIPID PANEL
Chol/HDL Ratio: 3.2 ratio (ref 0.0–5.0)
Cholesterol, Total: 135 mg/dL (ref 100–199)
HDL: 42 mg/dL (ref >39)
LDL Chol Calc (NIH): 71 mg/dL (ref 0–99)
Triglycerides: 126 mg/dL (ref 0–149)
VLDL Cholesterol Cal: 22 mg/dL (ref 5–40)
